# Patient Record
Sex: Female | Born: 1965 | Race: White | Hispanic: No | Marital: Single | Smoking: Never smoker
Health system: Southern US, Community
[De-identification: ages and names within clinical notes are randomized; demographics above are authoritative.]

## PROBLEM LIST (undated history)

## (undated) DIAGNOSIS — F32A Depression, unspecified: Secondary | ICD-10-CM

## (undated) DIAGNOSIS — J309 Allergic rhinitis, unspecified: Secondary | ICD-10-CM

## (undated) DIAGNOSIS — M21611 Bunion of right foot: Secondary | ICD-10-CM

## (undated) DIAGNOSIS — K219 Gastro-esophageal reflux disease without esophagitis: Secondary | ICD-10-CM

## (undated) DIAGNOSIS — F329 Major depressive disorder, single episode, unspecified: Secondary | ICD-10-CM

## (undated) DIAGNOSIS — Z973 Presence of spectacles and contact lenses: Secondary | ICD-10-CM

## (undated) DIAGNOSIS — F319 Bipolar disorder, unspecified: Secondary | ICD-10-CM

## (undated) DIAGNOSIS — M1990 Unspecified osteoarthritis, unspecified site: Secondary | ICD-10-CM

## (undated) DIAGNOSIS — F419 Anxiety disorder, unspecified: Secondary | ICD-10-CM

## (undated) HISTORY — PX: BUNIONECTOMY: SHX129

## (undated) HISTORY — PX: FOOT SURGERY: SHX648

---

## 2011-08-21 ENCOUNTER — Ambulatory Visit: Payer: Self-pay

## 2013-04-21 ENCOUNTER — Ambulatory Visit: Payer: Self-pay | Admitting: Obstetrics and Gynecology

## 2013-04-24 ENCOUNTER — Ambulatory Visit: Payer: Self-pay | Admitting: Obstetrics and Gynecology

## 2014-08-16 ENCOUNTER — Ambulatory Visit: Payer: Self-pay

## 2014-08-20 ENCOUNTER — Ambulatory Visit: Payer: Self-pay | Admitting: Family Medicine

## 2015-02-03 ENCOUNTER — Encounter: Payer: Self-pay | Admitting: *Deleted

## 2015-02-09 ENCOUNTER — Ambulatory Visit: Payer: BLUE CROSS/BLUE SHIELD | Admitting: Anesthesiology

## 2015-02-09 ENCOUNTER — Encounter
Admission: RE | Disposition: A | Discharge status: Home / Self Care | Payer: Self-pay | Source: Ambulatory Visit | Attending: Podiatry

## 2015-02-09 ENCOUNTER — Ambulatory Visit
Admission: RE | Admit: 2015-02-09 | Discharge: 2015-02-09 | Disposition: A | Discharge status: Home / Self Care | Payer: BLUE CROSS/BLUE SHIELD | Source: Ambulatory Visit | Attending: Podiatry | Admitting: Podiatry

## 2015-02-09 DIAGNOSIS — M2011 Hallux valgus (acquired), right foot: Secondary | ICD-10-CM | POA: Insufficient documentation

## 2015-02-09 HISTORY — DX: Unspecified osteoarthritis, unspecified site: M19.90

## 2015-02-09 HISTORY — PX: HALLUX VALGUS AUSTIN: SHX6623

## 2015-02-09 HISTORY — DX: Gastro-esophageal reflux disease without esophagitis: K21.9

## 2015-02-09 HISTORY — DX: Depression, unspecified: F32.A

## 2015-02-09 HISTORY — DX: Bipolar disorder, unspecified: F31.9

## 2015-02-09 HISTORY — DX: Presence of spectacles and contact lenses: Z97.3

## 2015-02-09 HISTORY — DX: Anxiety disorder, unspecified: F41.9

## 2015-02-09 HISTORY — DX: Major depressive disorder, single episode, unspecified: F32.9

## 2015-02-09 HISTORY — DX: Allergic rhinitis, unspecified: J30.9

## 2015-02-09 HISTORY — DX: Bunion of right foot: M21.611

## 2015-02-09 SURGERY — CORRECTION, HALLUX VALGUS
Anesthesia: Regional | Laterality: Right | Wound class: Clean

## 2015-02-09 MED ORDER — CEFAZOLIN SODIUM-DEXTROSE 2-3 GM-% IV SOLR
2.0000 g | Freq: Once | INTRAVENOUS | Status: AC
  Administered 2015-02-09: 2 g via INTRAVENOUS

## 2015-02-09 MED ORDER — OXYCODONE HCL 5 MG/5ML PO SOLN: 5.0000 mg | Freq: Once | ORAL | Status: DC | PRN

## 2015-02-09 MED ORDER — KETOROLAC TROMETHAMINE 30 MG/ML IJ SOLN
30.0000 mg | Freq: Once | INTRAMUSCULAR | Status: AC | PRN
  Administered 2015-02-09: 30 mg via INTRAVENOUS

## 2015-02-09 MED ORDER — DEXAMETHASONE SODIUM PHOSPHATE 4 MG/ML IJ SOLN
INTRAMUSCULAR | Status: DC | PRN
  Administered 2015-02-09: 8 mg via INTRAVENOUS

## 2015-02-09 MED ORDER — OXYCODONE-ACETAMINOPHEN 5-325 MG PO TABS: 1.0000 | ORAL_TABLET | ORAL | Status: DC | PRN

## 2015-02-09 MED ORDER — ONDANSETRON HCL 4 MG/2ML IJ SOLN: 4.0000 mg | Freq: Four times a day (QID) | INTRAMUSCULAR | Status: DC | PRN

## 2015-02-09 MED ORDER — OXYCODONE HCL 5 MG PO TABS: 5.0000 mg | ORAL_TABLET | Freq: Once | ORAL | Status: DC | PRN

## 2015-02-09 MED ORDER — LACTATED RINGERS IV SOLN
INTRAVENOUS | Status: DC
  Administered 2015-02-09: 07:00:00 via INTRAVENOUS

## 2015-02-09 MED ORDER — ROPIVACAINE HCL 5 MG/ML IJ SOLN
INTRAMUSCULAR | Status: DC | PRN
  Administered 2015-02-09: 200 mg via EPIDURAL

## 2015-02-09 MED ORDER — PROPOFOL 10 MG/ML IV BOLUS
INTRAVENOUS | Status: DC | PRN
  Administered 2015-02-09: 150 mg via INTRAVENOUS

## 2015-02-09 MED ORDER — FENTANYL CITRATE (PF) 100 MCG/2ML IJ SOLN
INTRAMUSCULAR | Status: DC | PRN
  Administered 2015-02-09: 100 ug via INTRAVENOUS

## 2015-02-09 MED ORDER — LIDOCAINE HCL (CARDIAC) 20 MG/ML IV SOLN
INTRAVENOUS | Status: DC | PRN
  Administered 2015-02-09: 40 mg via INTRATRACHEAL

## 2015-02-09 MED ORDER — ONDANSETRON HCL 4 MG/2ML IJ SOLN
INTRAMUSCULAR | Status: DC | PRN
  Administered 2015-02-09: 4 mg via INTRAVENOUS

## 2015-02-09 MED ORDER — BUPIVACAINE HCL (PF) 0.25 % IJ SOLN
INTRAMUSCULAR | Status: DC | PRN
  Administered 2015-02-09: 10 mL

## 2015-02-09 MED ORDER — MIDAZOLAM HCL 5 MG/5ML IJ SOLN
INTRAMUSCULAR | Status: DC | PRN
  Administered 2015-02-09: 2 mg via INTRAVENOUS

## 2015-02-09 MED ORDER — HYDROMORPHONE HCL 1 MG/ML IJ SOLN: 0.2500 mg | INTRAMUSCULAR | Status: DC | PRN

## 2015-02-09 MED ORDER — ONDANSETRON HCL 4 MG PO TABS: 4.0000 mg | ORAL_TABLET | Freq: Four times a day (QID) | ORAL | Status: DC | PRN

## 2015-02-09 SURGICAL SUPPLY — 50 items
BENZOIN TINCTURE PRP APPL 2/3 (GAUZE/BANDAGES/DRESSINGS) ×2 IMPLANT
BLADE MED AGGRESSIVE (BLADE) ×2 IMPLANT
BLADE OSC/SAGITTAL 5.5X25 (BLADE) IMPLANT
BLADE OSC/SAGITTAL MD 5.5X18 (BLADE) IMPLANT
BLADE OSC/SAGITTAL MD 9X18.5 (BLADE) IMPLANT
BLADE SURG 15 STRL LF DISP TIS (BLADE) IMPLANT
BLADE SURG 15 STRL SS (BLADE)
BNDG ESMARK 4X12 TAN STRL LF (GAUZE/BANDAGES/DRESSINGS) ×2 IMPLANT
BNDG GAUZE 4.5X4.1 6PLY STRL (MISCELLANEOUS) ×2 IMPLANT
BNDG STRETCH 4X75 STRL LF (GAUZE/BANDAGES/DRESSINGS) ×2 IMPLANT
CANISTER SUCT 1200ML W/VALVE (MISCELLANEOUS) ×2 IMPLANT
COVER PIN YLW 0.028-062 (MISCELLANEOUS) IMPLANT
CUFF TOURNIQUET DUAL PORT 18X3 (MISCELLANEOUS) ×2 IMPLANT
DRAPE FLUOR MINI C-ARM 54X84 (DRAPES) ×2 IMPLANT
DURAPREP 26ML APPLICATOR (WOUND CARE) ×2 IMPLANT
GAUZE PETRO XEROFOAM 1X8 (MISCELLANEOUS) ×2 IMPLANT
GAUZE PETRO XEROFOAM 5X9 (MISCELLANEOUS) IMPLANT
GAUZE SPONGE 4X4 12PLY STRL (GAUZE/BANDAGES/DRESSINGS) ×2 IMPLANT
GLOVE BIO SURGEON STRL SZ7.5 (GLOVE) ×2 IMPLANT
GLOVE INDICATOR 8.0 STRL GRN (GLOVE) ×2 IMPLANT
GOWN STRL REUS W/ TWL LRG LVL3 (GOWN DISPOSABLE) ×2 IMPLANT
GOWN STRL REUS W/TWL LRG LVL3 (GOWN DISPOSABLE) ×2
K-WIRE DBL END TROCAR 6X.045 (WIRE) ×2
K-WIRE DBL END TROCAR 6X.062 (WIRE) ×2
KWIRE DBL END TROCAR 6X.045 (WIRE) ×1 IMPLANT
KWIRE DBL END TROCAR 6X.062 (WIRE) ×1 IMPLANT
NS IRRIG 500ML POUR BTL (IV SOLUTION) ×2 IMPLANT
PACK EXTREMITY ARMC (MISCELLANEOUS) ×2 IMPLANT
PAD GROUND ADULT SPLIT (MISCELLANEOUS) ×2 IMPLANT
RASP SM TEAR CROSS CUT (RASP) ×2 IMPLANT
SPLINT CAST 1 STEP 4X30 (MISCELLANEOUS) IMPLANT
STOCKINETTE STRL 6IN 960660 (GAUZE/BANDAGES/DRESSINGS) ×2 IMPLANT
STRIP CLOSURE SKIN 1/4X4 (GAUZE/BANDAGES/DRESSINGS) ×2 IMPLANT
SUT ETHILON 4-0 (SUTURE)
SUT ETHILON 4-0 FS2 18XMFL BLK (SUTURE)
SUT ETHILON 5-0 FS-2 18 BLK (SUTURE) IMPLANT
SUT MNCRL 4-0 (SUTURE)
SUT MNCRL 4-0 27XMFL (SUTURE)
SUT MNCRL 5-0+ PC-1 (SUTURE) ×1 IMPLANT
SUT MONOCRYL 5-0 (SUTURE) ×1
SUT VIC AB 0 CT1 36 (SUTURE) IMPLANT
SUT VIC AB 2-0 SH 27 (SUTURE)
SUT VIC AB 2-0 SH 27XBRD (SUTURE) IMPLANT
SUT VIC AB 3-0 SH 27 (SUTURE) ×1
SUT VIC AB 3-0 SH 27X BRD (SUTURE) ×1 IMPLANT
SUT VIC AB 4-0 FS2 27 (SUTURE) ×2 IMPLANT
SUT VICRYL AB 3-0 FS1 BRD 27IN (SUTURE) IMPLANT
SUTURE ETHLN 4-0 FS2 18XMF BLK (SUTURE) IMPLANT
SUTURE MNCRL 4-0 27XMF (SUTURE) IMPLANT
k wire (Wire) ×2 IMPLANT

## 2015-02-09 NOTE — Anesthesia Procedure Notes (Addendum)
Procedure Name: LMA Insertion Date/Time: 02/09/2015 7:44 AM Performed by: Andee Poles Pre-anesthesia Checklist: Patient identified, Emergency Drugs available, Suction available, Timeout performed and Patient being monitored Patient Re-evaluated:Patient Re-evaluated prior to inductionOxygen Delivery Method: Circle system utilized Preoxygenation: Pre-oxygenation with 100% oxygen Intubation Type: IV induction LMA: LMA inserted LMA Size: 4.0 Number of attempts: 1 Placement Confirmation: positive ETCO2 and breath sounds checked- equal and bilateral Tube secured with: Tape   Anesthesia Regional Block:  Popliteal block  Pre-Anesthetic Checklist: ,, timeout performed, Correct Patient, Correct Site, Correct Laterality, Correct Procedure, Correct Position, site marked, Risks and benefits discussed,  Surgical consent,  Pre-op evaluation,  At surgeon's request and post-op pain management  Laterality: Right  Prep: chloraprep       Needles:  Injection technique: Single-shot  Needle Type: Echogenic Needle     Needle Length: 9cm 9 cm Needle Gauge: 21 and 21 G    Additional Needles:  Procedures: ultrasound guided (picture in chart) Popliteal block Narrative:  Injection made incrementally with aspirations every 5 mL.  Performed by: Personally  Anesthesiologist: Durene Fruits  Additional Notes: Functioning IV was confirmed and monitors applied. Ultrasound guidance: relevant anatomy identified, needle position confirmed, local anesthetic spread visualized around nerve(s)., vascular puncture avoided.  Image printed for medical record.  Negative aspiration and no paresthesias; incremental administration of local anesthetic. The patient tolerated the procedure well. Vitals signes recorded in RN notes.

## 2015-02-09 NOTE — Anesthesia Postprocedure Evaluation (Signed)
  Anesthesia Post-op Note  Patient: Rebecca Pena  Procedure(s) Performed: Procedure(s) with comments: HALLUX VALGUS [place] (Right) - POPLITEAL  Anesthesia type:Regional, General  Patient location: PACU  Post pain: Pain level controlled  Post assessment: Post-op Vital signs reviewed, Patient's Cardiovascular Status Stable, Respiratory Function Stable, Patent Airway and No signs of Nausea or vomiting  Post vital signs: Reviewed and stable  Last Vitals:  Filed Vitals:   02/09/15 0915  BP: 118/79  Pulse: 70  Temp:   Resp: 10    Level of consciousness: awake, alert  and patient cooperative  Complications: No apparent anesthesia complications

## 2015-02-09 NOTE — Transfer of Care (Signed)
Immediate Anesthesia Transfer of Care Note  Patient: Rebecca Pena  Procedure(s) Performed: Procedure(s) with comments: HALLUX VALGUS [place]IN (Right) - POPLITEAL  Patient Location: PACU  Anesthesia Type: Regional, General  Level of Consciousness: awake, alert  and patient cooperative  Airway and Oxygen Therapy: Patient Spontanous Breathing and Patient connected to supplemental oxygen  Post-op Assessment: Post-op Vital signs reviewed, Patient's Cardiovascular Status Stable, Respiratory Function Stable, Patent Airway and No signs of Nausea or vomiting  Post-op Vital Signs: Reviewed and stable  Complications: No apparent anesthesia complications

## 2015-02-09 NOTE — H&P (Signed)
HISTORY AND PHYSICAL INTERVAL NOTE:  02/09/2015  7:23 AM  Rebecca Pena  has presented today for surgery, with the diagnosis of M20.11 HALLUX VALGUS.  The various methods of treatment have been discussed with the patient.  No guarantees were given.  After consideration of risks, benefits and other options for treatment, the patient has consented to surgery.  I have reviewed the patients' chart and labs.    Patient Vitals for the past 24 hrs:  BP Temp Pulse Resp SpO2 Height Weight  02/09/15 0659 118/78 mmHg 98.1 F (36.7 C) 68 16 100 %  (1.676 m) 67.132 kg (148 lb)    A history and physical examination was performed in my office.  The patient was reexamined.  There have been no changes to this history and physical examination.  Rebecca Pena A

## 2015-02-09 NOTE — Anesthesia Preprocedure Evaluation (Signed)
Anesthesia Evaluation  Patient identified by MRN, date of birth, ID band Patient awake    Reviewed: Allergy & Precautions, NPO status , Patient's Chart, lab work & pertinent test results  Airway Mallampati: II  TM Distance: >3 FB Neck ROM: Full    Dental   Pulmonary    Pulmonary exam normal       Cardiovascular Normal cardiovascular exam    Neuro/Psych PSYCHIATRIC DISORDERS Anxiety Depression Bipolar Disorder    GI/Hepatic GERD-  ,  Endo/Other    Renal/GU      Musculoskeletal   Abdominal   Peds  Hematology   Anesthesia Other Findings   Reproductive/Obstetrics                             Anesthesia Physical Anesthesia Plan  ASA: II  Anesthesia Plan: Regional and General   Post-op Pain Management: MAC Combined w/ Regional for Post-op pain   Induction: Intravenous  Airway Management Planned: LMA  Additional Equipment:   Intra-op Plan:   Post-operative Plan:   Informed Consent: I have reviewed the patients History and Physical, chart, labs and discussed the procedure including the risks, benefits and alternatives for the proposed anesthesia with the patient or authorized representative who has indicated his/her understanding and acceptance.     Plan Discussed with: CRNA  Anesthesia Plan Comments:         Anesthesia Quick Evaluation

## 2015-02-09 NOTE — Discharge Instructions (Signed)
[facility] °[facility] ° °POST OPERATIVE INSTRUCTIONS FOR DR. TROXLER AND DR. FOWLER °[facility] ° ° °1. Take your medication as prescribed.  Pain medication should be taken only as needed. ° °2. Keep the dressing clean, dry and intact. ° °3. Keep your foot elevated above the heart level for the first 48 hours. ° °4. Walking to the bathroom and brief periods of walking are acceptable, unless we have instructed you to be non-weight bearing. ° °5. Always wear your post-op shoe when walking.  Always use your crutches if you are to be non-weight bearing. ° °6. Do not take a shower. Baths are permissible as long as the foot is kept out of the water.  ° °7. Every hour you are awake:  °- Bend your knee 15 times. °- Flex foot 15 times °- Massage calf 15 times ° °8. Call [facility] ([phone]) if any of the following problems occur: °- You develop a temperature or fever. °- The bandage becomes saturated with blood. °- Medication does not stop your pain. °- Injury of the foot occurs. °- Any symptoms of infection including redness, odor, or red streaks running from wound. °-  ° °General Anesthesia, Care After °Refer to this sheet in the next few weeks. These instructions provide you with information on caring for yourself after your procedure. Your health care provider may also give you more specific instructions. Your treatment has been planned according to current medical practices, but problems sometimes occur. Call your health care provider if you have any problems or questions after your procedure. °WHAT TO EXPECT AFTER THE PROCEDURE °After the procedure, it is typical to experience: °· Sleepiness. °· Nausea and vomiting. °HOME CARE INSTRUCTIONS °· For the first 24 hours after general anesthesia: °¨ Have a responsible person with you. °¨ Do not drive a car. If you are alone, do not take public transportation. °¨ Do not drink alcohol. °¨ Do not take medicine  that has not been prescribed by your health care provider. °¨ Do not sign important papers or make important decisions. °¨ You may resume a normal diet and activities as directed by your health care provider. °· Change bandages (dressings) as directed. °· If you have questions or problems that seem related to general anesthesia, call the hospital and ask for the anesthetist or anesthesiologist on call. °SEEK MEDICAL CARE IF: °· You have nausea and vomiting that continue the day after anesthesia. °· You develop a rash. °SEEK IMMEDIATE MEDICAL CARE IF:  °· You have difficulty breathing. °· You have chest pain. °· You have any allergic problems. °Document Released: 10/01/2000 Document Revised: 06/30/2013 Document Reviewed: 01/08/2013 °ExitCare® Patient Information ©2015 [employer]. This information is not intended to replace advice given to you by your health care provider. Make sure you discuss any questions you have with your health care provider. ° °

## 2015-02-09 NOTE — Op Note (Signed)
Operative note   Surgeon:Tamiya Colello [employer] forces logistics/support/administrative officer: None    Preop diagnosis: Hallux valgus right foot    Postop diagnosis: Same    Procedure: [place] hallux valgus correction right foot    EBL: Minimal    Anesthesia:regional and general    Hemostasis: Ankle tourniquet inflated to 250 mmHg for approximately 40 minutes    Specimen: None    Complications: None    Operative indications: 49 year old female with a complaint of a hallux valgus deformity on her right foot. She had undergone previous surgical intervention on her left foot. She presents today for surgery. The risks benefits alternatives and competitions associated were discussed with the patient in for an consent has been given    Procedure:  Patient was brought into the OR and placed on the operating table in thesupine position. After anesthesia was obtained theright lower extremity was prepped and draped in usual sterile fashion.  Attention was directed to the dorsomedial right first MTPJ where a linear incision was made. Sharp and blunt dissection was carried down to the capsule. Next a T capsulotomy was performed. The prominent dorsal medial eminence was transected with a power saw. A V osteotomy was then created from medial to lateral. This was translocated laterally. This was stabilized with a 0.062 K wire. The ensuing overhanging ledge was then transected with a power saw. All areas were smoothed with a power rasp. Good realignment was noted on fluoroscopy. Good range of motion was noted to the first MTPJ. At this time closure was then performed. Small capsulorrhaphy was performed medially. 3-0 Vicryl was used for the capsular closure. 4-0 Vicryl was used for the subcutaneous tissue. 5-0 Monocryl was used for the skin. 0.25% Marcaine was then placed around all areas. A well compressive bulky sterile dressing was then placed.    Patient tolerated the procedure and anesthesia well.  Was transported from the OR to the PACU  with all vital signs stable and vascular status intact. To be discharged per routine protocol.  Will follow up in approximately 1 week in the outpatient clinic.

## 2015-02-10 ENCOUNTER — Encounter: Payer: Self-pay | Admitting: Podiatry

## 2015-03-16 ENCOUNTER — Other Ambulatory Visit: Payer: Self-pay | Admitting: Obstetrics & Gynecology

## 2015-03-16 DIAGNOSIS — N63 Unspecified lump in unspecified breast: Secondary | ICD-10-CM

## 2015-03-28 ENCOUNTER — Other Ambulatory Visit: Payer: BLUE CROSS/BLUE SHIELD

## 2015-03-28 ENCOUNTER — Ambulatory Visit: Payer: BLUE CROSS/BLUE SHIELD

## 2015-05-12 ENCOUNTER — Other Ambulatory Visit: Payer: Self-pay | Admitting: Obstetrics & Gynecology

## 2015-05-12 ENCOUNTER — Ambulatory Visit
Admission: RE | Admit: 2015-05-12 | Discharge: 2015-05-12 | Disposition: A | Discharge status: Home / Self Care | Payer: BLUE CROSS/BLUE SHIELD | Source: Ambulatory Visit | Attending: Obstetrics & Gynecology | Admitting: Obstetrics & Gynecology

## 2015-05-12 DIAGNOSIS — N63 Unspecified lump in unspecified breast: Secondary | ICD-10-CM

## 2015-05-12 DIAGNOSIS — N6002 Solitary cyst of left breast: Secondary | ICD-10-CM | POA: Insufficient documentation

## 2016-01-31 ENCOUNTER — Other Ambulatory Visit: Payer: Self-pay | Admitting: Gastroenterology

## 2016-01-31 DIAGNOSIS — K219 Gastro-esophageal reflux disease without esophagitis: Secondary | ICD-10-CM

## 2016-01-31 DIAGNOSIS — R1013 Epigastric pain: Secondary | ICD-10-CM

## 2016-02-09 ENCOUNTER — Ambulatory Visit
Admission: RE | Admit: 2016-02-09 | Discharge: 2016-02-09 | Disposition: A | Discharge status: Home / Self Care | Payer: BLUE CROSS/BLUE SHIELD | Source: Ambulatory Visit | Attending: Gastroenterology | Admitting: Gastroenterology

## 2016-02-09 DIAGNOSIS — R1013 Epigastric pain: Secondary | ICD-10-CM | POA: Diagnosis present

## 2016-02-09 DIAGNOSIS — K219 Gastro-esophageal reflux disease without esophagitis: Secondary | ICD-10-CM | POA: Insufficient documentation

## 2016-02-28 ENCOUNTER — Other Ambulatory Visit: Payer: Self-pay | Admitting: Obstetrics & Gynecology

## 2016-02-28 DIAGNOSIS — Z1231 Encounter for screening mammogram for malignant neoplasm of breast: Secondary | ICD-10-CM

## 2016-02-29 ENCOUNTER — Ambulatory Visit
Admission: RE | Admit: 2016-02-29 | Discharge: 2016-02-29 | Disposition: A | Discharge status: Home / Self Care | Payer: BLUE CROSS/BLUE SHIELD | Source: Ambulatory Visit | Attending: Obstetrics & Gynecology | Admitting: Obstetrics & Gynecology

## 2016-02-29 ENCOUNTER — Other Ambulatory Visit: Payer: Self-pay | Admitting: Obstetrics & Gynecology

## 2016-02-29 DIAGNOSIS — Z1231 Encounter for screening mammogram for malignant neoplasm of breast: Secondary | ICD-10-CM | POA: Insufficient documentation

## 2016-05-03 ENCOUNTER — Encounter: Payer: Self-pay | Admitting: *Deleted

## 2016-05-04 ENCOUNTER — Ambulatory Visit: Payer: BLUE CROSS/BLUE SHIELD | Admitting: Certified Registered Nurse Anesthetist

## 2016-05-04 ENCOUNTER — Encounter
Admission: RE | Disposition: A | Discharge status: Home / Self Care | Payer: Self-pay | Source: Ambulatory Visit | Attending: Gastroenterology

## 2016-05-04 ENCOUNTER — Encounter: Payer: Self-pay | Admitting: Certified Registered Nurse Anesthetist

## 2016-05-04 ENCOUNTER — Ambulatory Visit
Admission: RE | Admit: 2016-05-04 | Discharge: 2016-05-04 | Disposition: A | Discharge status: Home / Self Care | Payer: BLUE CROSS/BLUE SHIELD | Source: Ambulatory Visit | Attending: Gastroenterology | Admitting: Gastroenterology

## 2016-05-04 DIAGNOSIS — M19071 Primary osteoarthritis, right ankle and foot: Secondary | ICD-10-CM | POA: Diagnosis not present

## 2016-05-04 DIAGNOSIS — F319 Bipolar disorder, unspecified: Secondary | ICD-10-CM | POA: Diagnosis not present

## 2016-05-04 DIAGNOSIS — R1013 Epigastric pain: Secondary | ICD-10-CM | POA: Diagnosis not present

## 2016-05-04 DIAGNOSIS — K5730 Diverticulosis of large intestine without perforation or abscess without bleeding: Secondary | ICD-10-CM | POA: Diagnosis not present

## 2016-05-04 DIAGNOSIS — M170 Bilateral primary osteoarthritis of knee: Secondary | ICD-10-CM | POA: Diagnosis not present

## 2016-05-04 DIAGNOSIS — K3189 Other diseases of stomach and duodenum: Secondary | ICD-10-CM | POA: Insufficient documentation

## 2016-05-04 DIAGNOSIS — Z1211 Encounter for screening for malignant neoplasm of colon: Secondary | ICD-10-CM | POA: Insufficient documentation

## 2016-05-04 DIAGNOSIS — K219 Gastro-esophageal reflux disease without esophagitis: Secondary | ICD-10-CM | POA: Diagnosis not present

## 2016-05-04 DIAGNOSIS — M19072 Primary osteoarthritis, left ankle and foot: Secondary | ICD-10-CM | POA: Insufficient documentation

## 2016-05-04 HISTORY — PX: COLONOSCOPY: SHX5424

## 2016-05-04 HISTORY — PX: ESOPHAGOGASTRODUODENOSCOPY (EGD) WITH PROPOFOL: SHX5813

## 2016-05-04 LAB — POCT PREGNANCY, URINE: Preg Test, Ur: NEGATIVE

## 2016-05-04 SURGERY — COLONOSCOPY
Anesthesia: General

## 2016-05-04 MED ORDER — MIDAZOLAM HCL 2 MG/2ML IJ SOLN
INTRAMUSCULAR | Status: DC | PRN
  Administered 2016-05-04: 2 mg via INTRAVENOUS

## 2016-05-04 MED ORDER — PROPOFOL 500 MG/50ML IV EMUL
INTRAVENOUS | Status: DC | PRN
  Administered 2016-05-04: 120 ug/kg/min via INTRAVENOUS

## 2016-05-04 MED ORDER — LIDOCAINE HCL (CARDIAC) 20 MG/ML IV SOLN
INTRAVENOUS | Status: DC | PRN
  Administered 2016-05-04: 30 mg via INTRAVENOUS

## 2016-05-04 MED ORDER — ONDANSETRON HCL 4 MG/2ML IJ SOLN
INTRAMUSCULAR | Status: DC | PRN
  Administered 2016-05-04: 4 mg via INTRAVENOUS

## 2016-05-04 MED ORDER — SODIUM CHLORIDE 0.9 % IV SOLN: INTRAVENOUS | Status: DC

## 2016-05-04 MED ORDER — PROPOFOL 10 MG/ML IV BOLUS
INTRAVENOUS | Status: DC | PRN
  Administered 2016-05-04: 10 mg via INTRAVENOUS
  Administered 2016-05-04: 20 mg via INTRAVENOUS
  Administered 2016-05-04 (×2): 30 mg via INTRAVENOUS

## 2016-05-04 MED ORDER — SODIUM CHLORIDE 0.9 % IV SOLN
INTRAVENOUS | Status: DC
  Administered 2016-05-04: 1000 mL via INTRAVENOUS

## 2016-05-04 NOTE — Anesthesia Postprocedure Evaluation (Signed)
Anesthesia Post Note  Patient: Rebecca Pena  Procedure(s) Performed: Procedure(s) (LRB): COLONOSCOPY (N/A) ESOPHAGOGASTRODUODENOSCOPY (EGD) WITH PROPOFOL  Patient location during evaluation: PACU Anesthesia Type: General Level of consciousness: awake Pain management: pain level controlled Vital Signs Assessment: post-procedure vital signs reviewed and stable Respiratory status: nonlabored ventilation Cardiovascular status: stable Anesthetic complications: no    Last Vitals:  Vitals:   05/04/16 0903 05/04/16 0913  BP:  106/75  Pulse: 62 61  Resp: (!) 22 14  Temp:      Last Pain:  Vitals:   05/04/16 0853  TempSrc: Tympanic                 VAN STAVEREN,Kadir Azucena

## 2016-05-04 NOTE — Anesthesia Preprocedure Evaluation (Addendum)
Anesthesia Evaluation  Patient identified by MRN, date of birth, ID band Patient awake    Reviewed: Allergy & Precautions, NPO status , Patient's Chart, lab work & pertinent test results  Airway Mallampati: II       Dental  (+) Teeth Intact   Pulmonary neg pulmonary ROS,    breath sounds clear to auscultation       Cardiovascular Exercise Tolerance: Good  Rhythm:Regular Rate:Normal     Neuro/Psych Depression Bipolar Disorder    GI/Hepatic Neg liver ROS, GERD  Medicated,  Endo/Other  negative endocrine ROS  Renal/GU      Musculoskeletal   Abdominal Normal abdominal exam  (+)   Peds negative pediatric ROS (+)  Hematology negative hematology ROS (+)   Anesthesia Other Findings   Reproductive/Obstetrics                             Anesthesia Physical Anesthesia Plan  ASA: II  Anesthesia Plan: General   Post-op Pain Management:    Induction: Intravenous  Airway Management Planned: Natural Airway and Nasal Cannula  Additional Equipment:   Intra-op Plan:   Post-operative Plan:   Informed Consent: I have reviewed the patients History and Physical, chart, labs and discussed the procedure including the risks, benefits and alternatives for the proposed anesthesia with the patient or authorized representative who has indicated his/her understanding and acceptance.     Plan Discussed with: CRNA  Anesthesia Plan Comments:         Anesthesia Quick Evaluation

## 2016-05-04 NOTE — H&P (Signed)
Outpatient short stay form Pre-procedure 05/04/2016 7:44 AM Rebecca Pena Stephane Junkins MD  Primary Physician: Dr Liam GrahamArrita Pena  Reason for visit:  EGD and colonoscopy  History of present illness:  Patient is a 50 year old female presenting today as above. She has personal history of some reflux issues with excessive eructation that has been more so noted since this year began. She does not have any dysphagia. He did have a barium swallow with normal tablet movement and small amount reflux. Further she is presenting for a screening colonoscopy. This is this is her first colonoscopy. She tolerated her prep well. She takes no aspirin or blood thinning agents.    Current Facility-Administered Medications:  .  0.9 %  sodium chloride infusion, , Intravenous, Continuous, Rebecca Pena Gelila Well, MD, Last Rate: 20 mL/hr at 05/04/16 0723, 1,000 mL at 05/04/16 0723 .  0.9 %  sodium chloride infusion, , Intravenous, Continuous, Rebecca Pena Thermon Zulauf, MD  Prescriptions Prior to Admission  Medication Sig Dispense Refill Last Dose  . divalproex (DEPAKOTE ER) 500 MG 24 hr tablet Take 1,000 mg by mouth daily. NIGHTLY   02/08/2015 at Unknown time  . fexofenadine (ALLEGRA) 30 MG tablet Take 30 mg by mouth daily.   02/08/2015 at Unknown time  . fluticasone (FLONASE) 50 MCG/ACT nasal spray Place into both nostrils daily.   Past Week at Unknown time  . ipratropium (ATROVENT) 0.03 % nasal spray Place 2 sprays into both nostrils 2 (two) times daily.   Not Taking at Unknown time  . lithium carbonate (LITHOBID) 300 MG CR tablet Take 600 mg by mouth at bedtime.   02/08/2015 at Unknown time  . montelukast (SINGULAIR) 10 MG tablet Take 10 mg by mouth at bedtime.   Past Week at Unknown time  . oxyCODONE-acetaminophen (ROXICET) 5-325 MG per tablet Take 1-2 tablets by mouth every 4 (four) hours as needed for severe pain. 30 tablet 0      No Known Allergies   Past Medical History:  Diagnosis Date  . Allergic rhinitis    SINUSITIS  .  Anxiety   . Arthritis    feet, knees  . Bipolar affective disorder (HCC)    [facility]  . Bunion of right foot   . Depression   . GERD (gastroesophageal reflux disease)   . Wears contact lenses    one eye only    Review of systems:      Physical Exam    Heart and lungs: Regular rate and rhythm without rub or gallop, lungs are bilaterally clear.    HEENT: Normocephalic atraumatic eyes are anicteric    Other:     Pertinant exam for procedure: Soft nontender nondistended bowel sounds positive normoactive.    Planned proceedures: EGD, colonoscopy and indicated procedures. I have discussed the risks benefits and complications of procedures to include not limited to bleeding, infection, perforation and the risk of sedation and the patient wishes to proceed.    Rebecca Pena Rebecca Mcglown, MD Gastroenterology 05/04/2016  7:44 AM

## 2016-05-04 NOTE — Anesthesia Procedure Notes (Signed)
Date/Time: 05/04/2016 7:45 AM Performed by: Ginger CarneMICHELET, Jeffree Cazeau Pre-anesthesia Checklist: Patient identified, Emergency Drugs available, Suction available, Patient being monitored and Timeout performed Patient Re-evaluated:Patient Re-evaluated prior to inductionOxygen Delivery Method: Nasal cannula

## 2016-05-04 NOTE — Op Note (Signed)
[facility] Gastroenterology Patient Name: Rebecca Pena Procedure Date: 05/04/2016 7:42 AM MRN: [id number] Account #: [id number] Date of Birth: 04-Jun-1966 Admit Type: Outpatient Age: 50 Room: [facility] ENDO ROOM 1 Gender: Female Note Status: Finalized Procedure:            Colonoscopy Indications:          Screening for colorectal malignant neoplasm Providers:            Christena Deem, MD Medicines:            Monitored Anesthesia Care Complications:        No immediate complications. Procedure:            Pre-Anesthesia Assessment:                       - ASA Grade Assessment: II - A patient with mild                        systemic disease.                       After obtaining informed consent, the colonoscope was                        passed under direct vision. Throughout the procedure,                        the patient's blood pressure, pulse, and oxygen                        saturations were monitored continuously. The                        Colonoscope was introduced through the anus and                        advanced to the the cecum, identified by appendiceal                        orifice and ileocecal valve. The colonoscopy was                        unusually difficult due to significant looping and a                        tortuous colon. Successful completion of the procedure                        was aided by changing the patient to a supine position,                        changing the patient to a prone position, using manual                        pressure and changing endoscopes. The patient tolerated                        the procedure well. The quality of the bowel                        preparation was good  except the sigmoid colon was fair. Findings:      A 3 mm polyp was found in the rectum. The polyp was sessile. The polyp       was removed with a cold biopsy forceps. Resection and retrieval were       complete.      Multiple  small and large-mouthed diverticula were found in the sigmoid       colon and distal descending colon.      The sigmoid colon, descending colon and splenic flexure were moderately       tortuous.      The retroflexed view of the distal rectum and anal verge was normal and       showed no anal or rectal abnormalities.      The digital rectal exam was normal.      The terminal ileum appeared normal. Impression:           - One 3 mm polyp in the rectum, removed with a cold                        biopsy forceps. Resected and retrieved.                       - Diverticulosis in the sigmoid colon and in the distal                        descending colon.                       - Tortuous colon.                       - The distal rectum and anal verge are normal on                        retroflexion view. Recommendation:       - Discharge patient to home. Procedure Code(s):    --- Professional ---                       [phone], Colonoscopy, flexible; with biopsy, single or                        multiple Diagnosis Code(s):    --- Professional ---                       Z12.11, Encounter for screening for malignant neoplasm                        of colon                       K62.1, Rectal polyp                       K57.30, Diverticulosis of large intestine without                        perforation or abscess without bleeding                       Q43.8, Other specified congenital malformations of  intestine CPT copyright 2016 American Medical Association. All rights reserved. The codes documented in this report are preliminary and upon coder review may  be revised to meet current compliance requirements. Christena DeemMartin U Ariaunna Longsworth, MD 05/04/2016 8:52:42 AM This report has been signed electronically. Number of Addenda: 0 Note Initiated On: 05/04/2016 7:42 AM Scope Withdrawal Time: 0 hours 8 minutes 41 seconds  Total Procedure Duration: 0 hours 36 minutes 4 seconds        [facility]

## 2016-05-04 NOTE — Op Note (Signed)
[facility] Gastroenterology Patient Name: Rebecca Pena Procedure Date: 05/04/2016 7:44 AM MRN: [id number] Account #: [id number] Date of Birth: 04/24/1966 Admit Type: Outpatient Age: 50 Room: [facility] ENDO ROOM 1 Gender: Female Note Status: Finalized Procedure:            Upper GI endoscopy Indications:          Epigastric abdominal pain Providers:            Christena Deem, MD Referring MD:         Lissa Morales, MD Medicines:            Monitored Anesthesia Care Complications:        No immediate complications. Procedure:            Pre-Anesthesia Assessment:                       - ASA Grade Assessment: II - A patient with mild                        systemic disease.                       After obtaining informed consent, the endoscope was                        passed under direct vision. Throughout the procedure,                        the patient's blood pressure, pulse, and oxygen                        saturations were monitored continuously. The Endoscope                        was introduced through the mouth, and advanced to the                        third part of duodenum. The upper GI endoscopy was                        accomplished without difficulty. The patient tolerated                        the procedure well. Findings:      The examined esophagus was normal. Biopsies were taken with a cold       forceps for histology. Two biopsies were obtained at the       gastroesophageal junction with cold forceps for histology.      Patchy minimal erythematous mucosa without bleeding was found in the       gastric body. Biopsies were taken with a cold forceps for histology.      The cardia and gastric fundus were normal on retroflexion.      Diffuse mild mucosal variance characterized by smoothness and altered       texture was found in the entire duodenum. Biopsies were taken with a       cold forceps for histology. Impression:           - Normal  esophagus. Biopsied.                       -  Erythematous mucosa in the gastric body. Biopsied.                       - Mucosal variant in the duodenum. Biopsied.                       - Two biopsies were obtained at the gastroesophageal                        junction. Recommendation:       - Continue present medications.                       - Perform a colonoscopy today. Procedure Code(s):    --- Professional ---                       [id number], Esophagogastroduodenoscopy, flexible, transoral;                        with biopsy, single or multiple Diagnosis Code(s):    --- Professional ---                       K31.89, Other diseases of stomach and duodenum                       R10.13, Epigastric pain CPT copyright 2016 American Medical Association. All rights reserved. The codes documented in this report are preliminary and upon coder review may  be revised to meet current compliance requirements. Christena DeemMartin U Skulskie, MD 05/04/2016 8:09:12 AM This report has been signed electronically. Number of Addenda: 0 Note Initiated On: 05/04/2016 7:44 AM      [facility]

## 2016-05-04 NOTE — Transfer of Care (Signed)
Immediate Anesthesia Transfer of Care Note  Patient: Rebecca HooverKimberly Rueger  Procedure(s) Performed: Procedure(s): COLONOSCOPY (N/A) ESOPHAGOGASTRODUODENOSCOPY (EGD) WITH PROPOFOL  Patient Location: PACU  Anesthesia Type:General  Level of Consciousness: sedated  Airway & Oxygen Therapy: Patient Spontanous Breathing and Patient connected to nasal cannula oxygen  Post-op Assessment: Report given to RN and Post -op Vital signs reviewed and stable  Post vital signs: Reviewed and stable  Last Vitals:  Vitals:   05/04/16 0703 05/04/16 0853  BP: 132/82 98/66  Pulse: 74 63  Resp: 16 14  Temp: 36.2 C 36.6 C    Last Pain:  Vitals:   05/04/16 0853  TempSrc: Tympanic         Complications: No apparent anesthesia complications

## 2016-05-07 ENCOUNTER — Encounter: Payer: Self-pay | Admitting: Gastroenterology

## 2016-05-07 LAB — SURGICAL PATHOLOGY

## 2016-10-24 ENCOUNTER — Ambulatory Visit (INDEPENDENT_AMBULATORY_CARE_PROVIDER_SITE_OTHER): Payer: BLUE CROSS/BLUE SHIELD | Admitting: Obstetrics & Gynecology

## 2016-10-24 ENCOUNTER — Encounter: Payer: Self-pay | Admitting: Obstetrics & Gynecology

## 2016-10-24 VITALS — BP 110/70 | HR 63 | Ht 66.0 in | Wt 149.0 lb

## 2016-10-24 DIAGNOSIS — A630 Anogenital (venereal) warts: Secondary | ICD-10-CM | POA: Diagnosis not present

## 2016-10-24 MED ORDER — IMIQUIMOD 5 % EX CREA: TOPICAL_CREAM | CUTANEOUS | 3 refills | Status: DC

## 2016-10-24 NOTE — Patient Instructions (Signed)
Imiquimod skin cream  What is this medicine?  IMIQUIMOD (i mi KWI mod) cream is used to treat external genital or anal warts. It is also used to treat other skin conditions such as actinic keratosis and certain types of skin cancer.  This medicine may be used for other purposes; ask your health care provider or pharmacist if you have questions.  COMMON BRAND NAME(S): Aldara, Zyclara  What should I tell my health care provider before I take this medicine?  They need to know if you have any of these conditions:  -decreased immune function  -an unusual or allergic reaction to imiquimod, other medicines, foods, dyes, or preservatives  -pregnant or trying to get pregnant  -breast-feeding  How should I use this medicine?  This medicine is for external use only. Do not take by mouth. Follow the directions on the prescription label. Apply just before bedtime. Wash your hands before and after use. Apply a thin layer of cream and massage gently into the affected areas until no longer visible. Do not use in the mouth, eyes or the vagina. Use this medicine only on the affected area as directed by your health care provider. Do not use for longer than prescribed. It is important not to use more medicine than prescribed. To do so may increase the chance of side effects.  Talk to your pediatrician regarding the use of this medicine in children. While this drug may be prescribed for children as young as 12 years of age for selected conditions, precautions do apply.  Overdosage: If you think you have taken too much of this medicine contact a poison control center or emergency room at once.  NOTE: This medicine is only for you. Do not share this medicine with others.  What if I miss a dose?  If you miss a dose, use it as soon as you can. If it is almost time for your next dose, use only that dose. Do not use double or extra doses.  What may interact with this medicine?  Interactions are not expected. Do not use any other medicines on  the treated area without asking your doctor or health care professional.  This list may not describe all possible interactions. Give your health care provider a list of all the medicines, herbs, non-prescription drugs, or dietary supplements you use. Also tell them if you smoke, drink alcohol, or use illegal drugs. Some items may interact with your medicine.  What should I watch for while using this medicine?  Visit your health care professional for regular checks on your progress.  Do not use this medicine until the skin has healed from any other drug (example: podofilox or podophyllin resin) or surgical skin treatment.  Females should receive regular pelvic exams while being treated for genital warts. Most patients see improvement within 4 weeks. It may take up to 16 weeks to see a full clearing of the warts. This medicine is not a cure. New warts may develop during or after treatment. Avoid sexual (genital, anal, oral) contact while the cream is on the skin. If warts are visible in the genital area, sexual contact should be avoided until the warts are treated. The use of latex condoms during sexual contact may reduce, but not entirely prevent, infecting others. This medicine may weaken condoms, diaphragms, cervical caps or other barrier devices and make them less effective as birth control. Do not cover the treated area with an airtight bandage. Cotton gauze dressings can be used. Cotton underwear can   be worn after using this medicine on the genital or anal area.  Actinic keratoses that were not seen before may appear during treatment and may later go away. The treatment area and surrounding area may lighten or darken after treatment with this medicine. These skin color changes may be permanent in some patients.  If you experience a skin reaction at the treatment site that interferes or prevents you from doing any daily activity, contact your health care provider. You may need a rest period from treatment.  Treatment may be restarted once the reaction has gotten better as recommended by your doctor or health care professional.  This medicine can make you more sensitive to the sun. Keep out of the sun. If you cannot avoid being in the sun, wear protective clothing and use sunscreen. Do not use sun lamps or tanning beds/booths.  What side effects may I notice from receiving this medicine?  Side effects that you should report to your doctor or health care professional as soon as possible:  -open sores with or without drainage  -skin infection  -skin rash  -unusual or severe skin reaction  Side effects that usually do not require medical attention (report to your doctor or health care professional if they continue or are bothersome):  -burning or itching  -redness of the skin (very common but is usually not painful or harmful)  -scabbing, crusting, or peeling skin  -skin that becomes hard or thickened  -swelling of the skin  This list may not describe all possible side effects. Call your doctor for medical advice about side effects. You may report side effects to FDA at 1-800-FDA-1088.  Where should I keep my medicine?  Keep out of the reach of children.  Store between 4 and 25 degrees C (39 and 77 degrees F). Do not freeze. Throw away any unused medicine after the expiration date. Discard packet after applying to affected area. Partial packets should not be saved or reused.  NOTE: This sheet is a summary. It may not cover all possible information. If you have questions about this medicine, talk to your doctor, pharmacist, or health care provider.   2018 Elsevier/Gold Standard (2008-06-08 10:33:25)

## 2016-10-24 NOTE — Progress Notes (Signed)
This is a 51 year old Caucasian/White female who presents for the evaluation of vulvar lesion(s). She describes the vulvar lesion(s) as white, raised. She indicates that she has noticed 2 lesion that the average approximately 0.2 centimeter to 0.8 centimeters in size.  She indicates she first noticed the problem 1 month ago. She admits to symptoms of itching, burning, irritation which she states are moderate.  The following aggravating factors are identified:physical activity. The following alleviating factors are identified: Vinegar soln.  She has not had a previous colposcopy for this condition. The lesion has not been biopsied. She has not had any previous treatment for this condition.  Recent stress (daughter).  Not currently sexually active.  In a relationship for >1 year, waiting for marriage (indeterminate).  PMHx: She  has a past medical history of Allergic rhinitis; Anxiety; Arthritis; Arthritis; Bipolar affective disorder (HCC); Bunion of right foot; Depression; GERD (gastroesophageal reflux disease); and Wears contact lenses. Also,  has a past surgical history that includes Bunionectomy; Hallux valgus [place] (Right, 02/09/2015); Colonoscopy (N/A, 05/04/2016); and Esophagogastroduodenoscopy (egd) with propofol (05/04/2016)., family history includes Breast cancer in her paternal aunt; Heart failure in her mother; Hypertension in her father and mother; Rheum arthritis in her mother.,  reports that she has never smoked. She has never used smokeless tobacco. She reports that she drinks about 1.8 oz of alcohol per week .  She has a current medication list which includes the following prescription(s): montelukast, divalproex, fluticasone, imiquimod, lithium carbonate, mometasone, polyethylene glycol powder, and zaleplon. Also, has No Known Allergies.  Review of Systems  Constitutional: Negative for chills, fever and malaise/fatigue.  HENT: Negative for congestion, sinus pain and sore throat.     Eyes: Negative for blurred vision and pain.  Respiratory: Negative for cough and wheezing.   Cardiovascular: Negative for chest pain and leg swelling.  Gastrointestinal: Negative for abdominal pain, constipation, diarrhea, heartburn, nausea and vomiting.  Genitourinary: Negative for dysuria, frequency, hematuria and urgency.  Musculoskeletal: Negative for back pain, joint pain, myalgias and neck pain.  Skin: Negative for itching and rash.  Neurological: Negative for dizziness, tremors and weakness.  Endo/Heme/Allergies: Does not bruise/bleed easily.  Psychiatric/Behavioral: Negative for depression. The patient is not nervous/anxious and does not have insomnia.     Objective: BP 110/70   Pulse 63   Ht  (1.676 m)   Wt 149 lb (67.6 kg)   BMI 24.05 kg/m  Physical Exam  Constitutional: She is oriented to person, place, and time. She appears well-developed and well-nourished. No distress.  Genitourinary: Vagina normal. Pelvic exam was performed with patient supine. There is no rash, tenderness or lesion on the right labia. There is no rash, tenderness or lesion on the left labia.    No erythema or bleeding in the vagina.  Genitourinary Comments: 2 small warty raised lesions  Abdominal: Soft. She exhibits no distension. There is no tenderness.  Musculoskeletal: Normal range of motion.  Neurological: She is alert and oriented to person, place, and time. No cranial nerve deficit.  Skin: Skin is warm and dry.  Psychiatric: She has a normal mood and affect.    ASSESSMENT/PLAN:    Problem List Items Addressed This Visit      Musculoskeletal and Integument   Condyloma acuminata - Primary   Relevant Medications   imiquimod (ALDARA) 5 % cream    Other tx options discussed. Stress induced a likely etiology.  Annamarie Major, MD, [facility], [facility] 10/24/2016  12:27  PM

## 2017-01-14 ENCOUNTER — Encounter: Payer: Self-pay | Admitting: *Deleted

## 2017-01-14 ENCOUNTER — Ambulatory Visit
Admission: EM | Admit: 2017-01-14 | Discharge: 2017-01-14 | Disposition: A | Discharge status: Home / Self Care | Payer: BLUE CROSS/BLUE SHIELD | Attending: Family Medicine | Admitting: Family Medicine

## 2017-01-14 DIAGNOSIS — B9689 Other specified bacterial agents as the cause of diseases classified elsewhere: Secondary | ICD-10-CM | POA: Diagnosis not present

## 2017-01-14 DIAGNOSIS — N760 Acute vaginitis: Secondary | ICD-10-CM

## 2017-01-14 DIAGNOSIS — N898 Other specified noninflammatory disorders of vagina: Secondary | ICD-10-CM | POA: Diagnosis not present

## 2017-01-14 DIAGNOSIS — B373 Candidiasis of vulva and vagina: Secondary | ICD-10-CM

## 2017-01-14 DIAGNOSIS — B3731 Acute candidiasis of vulva and vagina: Secondary | ICD-10-CM

## 2017-01-14 LAB — URINALYSIS, COMPLETE (UACMP) WITH MICROSCOPIC
Bilirubin Urine: NEGATIVE
Glucose, UA: NEGATIVE mg/dL
Hgb urine dipstick: NEGATIVE
Ketones, ur: NEGATIVE mg/dL
Leukocytes, UA: NEGATIVE
Nitrite: NEGATIVE
Protein, ur: NEGATIVE mg/dL
Specific Gravity, Urine: 1.015 (ref 1.005–1.030)
pH: 6.5 (ref 5.0–8.0)

## 2017-01-14 LAB — WET PREP, GENITAL
Sperm: NONE SEEN
Trich, Wet Prep: NONE SEEN

## 2017-01-14 LAB — CHLAMYDIA/NGC RT PCR (ARMC ONLY)
Chlamydia Tr: NOT DETECTED
N gonorrhoeae: NOT DETECTED

## 2017-01-14 MED ORDER — FLUCONAZOLE 150 MG PO TABS: 150.0000 mg | ORAL_TABLET | Freq: Every day | ORAL | 0 refills | Status: DC

## 2017-01-14 MED ORDER — VALACYCLOVIR HCL 1 G PO TABS: 1000.0000 mg | ORAL_TABLET | Freq: Two times a day (BID) | ORAL | 0 refills | Status: DC

## 2017-01-14 MED ORDER — METRONIDAZOLE 500 MG PO TABS: 500.0000 mg | ORAL_TABLET | Freq: Two times a day (BID) | ORAL | 0 refills | Status: DC

## 2017-01-14 NOTE — ED Provider Notes (Signed)
[facility]    CSN: [id number] Arrival date & time: 01/14/17  1700     History   Chief Complaint Chief Complaint  Patient presents with  . Vaginal Discharge    HPI Elwanda Moger is a 51 y.o. female.   51 yo female with a c/o vulvar pain and skin lesions for the past 4-7 days. Patient states that she was using a cream for genital warts that she's used before without any problems. Patient is wondering if she developed a reaction to the cream because now she has developed some other tender skin lesions and redness. States she's also had vaginal discharge. Denies any fevers or chills.    The history is provided by the patient.  Vaginal Discharge    Past Medical History:  Diagnosis Date  . Allergic rhinitis    SINUSITIS  . Anxiety   . Arthritis    feet, knees  . Arthritis   . Bipolar affective disorder (HCC)    [facility]  . Bunion of right foot   . Depression   . GERD (gastroesophageal reflux disease)   . Wears contact lenses    one eye only    Patient Active Problem List   Diagnosis Date Noted  . Condyloma acuminata 10/24/2016    Past Surgical History:  Procedure Laterality Date  . BUNIONECTOMY    . COLONOSCOPY N/A 05/04/2016   Procedure: COLONOSCOPY;  Surgeon: Christena Deem, MD;  Location: [facility] ENDOSCOPY;  Service: Endoscopy;  Laterality: N/A;  . ESOPHAGOGASTRODUODENOSCOPY (EGD) WITH PROPOFOL  05/04/2016   Procedure: ESOPHAGOGASTRODUODENOSCOPY (EGD) WITH PROPOFOL;  Surgeon: Christena Deem, MD;  Location: [facility] ENDOSCOPY;  Service: Endoscopy;;  . Claudia Pollock [place] Right 02/09/2015   Procedure: HALLUX VALGUS [place];  Surgeon: Gwyneth Revels, DPM;  Location[facility];  Service: Podiatry;  Laterality: Right;  POPLITEAL    OB History    Gravida Para Term Preterm AB Living   1 1 1     1    SAB TAB Ectopic Multiple Live Births                   Home Medications    Prior to Admission medications     Medication Sig Start Date End Date Taking? Authorizing Provider  divalproex (DEPAKOTE ER) 500 MG 24 hr tablet Take 1,000 mg by mouth.    Yes [provider]  imiquimod (ALDARA) 5 % cream Apply topically 3 (three) times a week. Apply until total clearance or maximum of 16 weeks 10/24/16  Yes Nadara Mustard, MD  ipratropium (ATROVENT) 0.03 % nasal spray Place 2 sprays into both nostrils daily.   Yes [provider]  lithium carbonate 300 MG capsule Take 600 mg by mouth daily.   Yes [provider]  montelukast (SINGULAIR) 10 MG tablet TAKE 1 TABLET NIGHTLY 10/28/15  Yes [provider]  polyethylene glycol powder (GLYCOLAX/MIRALAX) powder Take by mouth.   Yes [provider]  zaleplon (SONATA) 10 MG capsule  07/17/16  Yes [provider]  fluconazole (DIFLUCAN) 150 MG tablet Take 1 tablet (150 mg total) by mouth daily. 01/14/17   Payton Mccallum, MD  fluticasone (FLONASE) 50 MCG/ACT nasal spray by Nasal route.    [provider]  lithium carbonate (ESKALITH) 450 MG CR tablet  10/15/16   [provider]  metroNIDAZOLE (FLAGYL) 500 MG tablet Take 1 tablet (500 mg total) by mouth 2 (two) times daily. 01/14/17   Payton Mccallum, MD  mometasone (ELOCON) 0.1 % lotion  08/15/16   [provider]  valACYclovir (VALTREX) 1000 MG tablet Take 1 tablet (1,000 mg total) by mouth 2 (two) times daily. 01/14/17   Payton Mccallum, MD    Family History Family History  Problem Relation Age of Onset  . Breast cancer Paternal Aunt   . Hypertension Mother   . Heart failure Mother   . Rheum arthritis Mother   . Hypertension Father     Social History Social History  Substance Use Topics  . Smoking status: Never Smoker  . Smokeless tobacco: Never Used  . Alcohol use 1.8 oz/week    3 Glasses of wine per week     Allergies   Patient has no known allergies.   Review of Systems Review of Systems  Genitourinary: Positive for vaginal  discharge.     Physical Exam Triage Vital Signs ED Triage Vitals  Enc Vitals Group     BP 01/14/17 1715 113/73     Pulse Rate 01/14/17 1715 64     Resp 01/14/17 1715 16     Temp 01/14/17 1715 98.9 F (37.2 C)     Temp Source 01/14/17 1715 Oral     SpO2 01/14/17 1715 100 %     Weight 01/14/17 1716 154 lb (69.9 kg)     Height 01/14/17 1716 5\' 6"  (1.676 m)     Head Circumference --      Peak Flow --      Pain Score 01/14/17 1716 3     Pain Loc --      Pain Edu? --      Excl. in GC? --    No data found.   Updated Vital Signs BP 113/73 (BP Location: Left Arm)   Pulse 64   Temp 98.9 F (37.2 C) (Oral)   Resp 16   Ht 5\' 6"  (1.676 m)   Wt 154 lb (69.9 kg)   SpO2 100%   BMI 24.86 kg/m   Visual Acuity Right Eye Distance:   Left Eye Distance:   Bilateral Distance:    Right Eye Near:   Left Eye Near:    Bilateral Near:     Physical Exam  Constitutional: She appears well-developed and well-nourished. No distress.  Genitourinary:    Pelvic exam was performed with patient supine. There is rash, tenderness and lesion on the right labia. Cervix exhibits no discharge and no friability. No erythema or tenderness in the vagina. No signs of injury around the vagina. Vaginal discharge found.  Genitourinary Comments: External 2mm superficial ulcerations to right labia noted with mild surrounding erythema  Skin: She is not diaphoretic.  Nursing note and vitals reviewed.    UC Treatments / Results  Labs (all labs ordered are listed, but only abnormal results are displayed) Labs Reviewed  WET PREP, GENITAL - Abnormal; Notable for the following:       Result Value   Yeast Wet Prep HPF POC PRESENT (*)    Clue Cells Wet Prep HPF POC PRESENT (*)    WBC, Wet Prep HPF POC MANY (*)    All other components within normal limits  URINALYSIS, COMPLETE (UACMP) WITH MICROSCOPIC - Abnormal; Notable for the following:    Squamous Epithelial / LPF 0-5 (*)    Bacteria, UA RARE (*)     All other components within normal limits  HSV CULTURE AND TYPING  CHLAMYDIA/NGC RT PCR [facility] ONLY)    EKG  EKG Interpretation None  Radiology No results found.  Procedures Procedures (including critical care time)  Medications Ordered in UC Medications - No data to display   Initial Impression / Assessment and Plan / UC Course  I have reviewed the triage vital signs and the nursing notes.  Pertinent labs & imaging results that were available during my care of the patient were reviewed by me and considered in my medical decision making (see chart for details).       Final Clinical Impressions(s) / UC Diagnoses   Final diagnoses:  Vaginal lesion  BV (bacterial vaginosis)  Vaginal yeast infection    New Prescriptions Discharge Medication List as of 01/14/2017  6:41 PM    START taking these medications   Details  fluconazole (DIFLUCAN) 150 MG tablet Take 1 tablet (150 mg total) by mouth daily., Starting Mon 01/14/2017, Normal    metroNIDAZOLE (FLAGYL) 500 MG tablet Take 1 tablet (500 mg total) by mouth 2 (two) times daily., Starting Mon 01/14/2017, Normal    valACYclovir (VALTREX) 1000 MG tablet Take 1 tablet (1,000 mg total) by mouth 2 (two) times daily., Starting Mon 01/14/2017, Normal       1. Lab results and diagnosis reviewed with patient 2. rx as per orders above; reviewed possible side effects, interactions, risks and benefits  3. Await other tests/cultures and further management pending test results 4.Recommend patient stop cream for genital warts at this time 5. Follow-up prn if symptoms worsen or don't improve   Payton Mccallumonty, Baley Lorimer, MD 01/14/17 2016

## 2017-01-14 NOTE — ED Triage Notes (Signed)
Patient started having pelvic pain about one week ago. 4 days ago patient started using cream for genital warts causing the external genitalia to become irritated.

## 2017-01-17 LAB — HSV CULTURE AND TYPING

## 2017-01-18 ENCOUNTER — Telehealth: Payer: Self-pay | Admitting: *Deleted

## 2017-01-18 NOTE — Telephone Encounter (Signed)
Patient called requesting lab results. Communicated negative chlamydia, gonorrhea, and HSV results. As per Dr Judd Gaudieronty, patient directed to stop taking antiviral and continue taking antibiotic. Patient reported improvement. Advised patient to follow up with PCP or [facility] if symptoms persist.

## 2017-01-25 ENCOUNTER — Other Ambulatory Visit: Payer: Self-pay | Admitting: Obstetrics & Gynecology

## 2017-01-25 DIAGNOSIS — Z1231 Encounter for screening mammogram for malignant neoplasm of breast: Secondary | ICD-10-CM

## 2017-04-04 ENCOUNTER — Other Ambulatory Visit: Payer: Self-pay | Admitting: Obstetrics & Gynecology

## 2017-04-04 DIAGNOSIS — Z1231 Encounter for screening mammogram for malignant neoplasm of breast: Secondary | ICD-10-CM

## 2017-05-02 ENCOUNTER — Ambulatory Visit
Admission: RE | Admit: 2017-05-02 | Discharge: 2017-05-02 | Disposition: A | Discharge status: Home / Self Care | Payer: BLUE CROSS/BLUE SHIELD | Source: Ambulatory Visit | Attending: Obstetrics & Gynecology | Admitting: Obstetrics & Gynecology

## 2017-05-02 DIAGNOSIS — Z1231 Encounter for screening mammogram for malignant neoplasm of breast: Secondary | ICD-10-CM | POA: Insufficient documentation

## 2017-05-03 ENCOUNTER — Encounter: Payer: Self-pay | Admitting: Obstetrics & Gynecology

## 2017-06-11 ENCOUNTER — Telehealth: Payer: Self-pay

## 2017-06-11 DIAGNOSIS — A630 Anogenital (venereal) warts: Secondary | ICD-10-CM

## 2017-06-11 MED ORDER — IMIQUIMOD 5 % EX CREA: TOPICAL_CREAM | CUTANEOUS | 3 refills | Status: DC

## 2017-06-11 NOTE — Telephone Encounter (Signed)
Please advise if you want me to refill her Aldara cream

## 2017-06-11 NOTE — Telephone Encounter (Signed)
Called pt no answer mailbox full need to know is it the cream [facility] prescribed

## 2017-06-11 NOTE — Telephone Encounter (Signed)
Pt needs a refill but not sure the name of med. She thru away package after she finished the last one. [phone]

## 2017-06-11 NOTE — Telephone Encounter (Signed)
Approved I eRx it

## 2017-08-26 ENCOUNTER — Telehealth: Payer: Self-pay

## 2017-08-26 MED ORDER — METRONIDAZOLE 500 MG PO TABS: 500.0000 mg | ORAL_TABLET | Freq: Two times a day (BID) | ORAL | 1 refills | Status: DC

## 2017-08-26 NOTE — Telephone Encounter (Signed)
Yes

## 2017-08-26 NOTE — Telephone Encounter (Signed)
Can you send in rx for bv for her?

## 2017-08-26 NOTE — Telephone Encounter (Signed)
Pt states she has BV, thinks she was tx'd before c flagyl.  Let her know what she was tx'd c so she can contact pharm for refill. [phone] Called pt to let her know she needs to be seen b/c we haven't seen her since April.  Pt states PH gave her a rx to keep on hand.  She has lost this rx and that's why she is calling.  Has a very distinct odor, knows what it is, is an [employer], works [employer], has root canal on [employer]. So it will be hard for her to come in.  Adv I could send msg and see.  Msg sent per pt.

## 2018-02-04 ENCOUNTER — Other Ambulatory Visit (HOSPITAL_COMMUNITY)
Admission: RE | Admit: 2018-02-04 | Discharge: 2018-02-04 | Disposition: A | Discharge status: Home / Self Care | Payer: BLUE CROSS/BLUE SHIELD | Source: Ambulatory Visit | Attending: Obstetrics & Gynecology | Admitting: Obstetrics & Gynecology

## 2018-02-04 ENCOUNTER — Encounter: Payer: Self-pay | Admitting: Obstetrics & Gynecology

## 2018-02-04 ENCOUNTER — Ambulatory Visit (INDEPENDENT_AMBULATORY_CARE_PROVIDER_SITE_OTHER): Payer: BLUE CROSS/BLUE SHIELD | Admitting: Obstetrics & Gynecology

## 2018-02-04 VITALS — BP 120/80 | Ht 66.0 in | Wt 153.0 lb

## 2018-02-04 DIAGNOSIS — Z01411 Encounter for gynecological examination (general) (routine) with abnormal findings: Secondary | ICD-10-CM | POA: Diagnosis not present

## 2018-02-04 DIAGNOSIS — Z1231 Encounter for screening mammogram for malignant neoplasm of breast: Secondary | ICD-10-CM

## 2018-02-04 DIAGNOSIS — Z124 Encounter for screening for malignant neoplasm of cervix: Secondary | ICD-10-CM | POA: Diagnosis present

## 2018-02-04 DIAGNOSIS — Z1239 Encounter for other screening for malignant neoplasm of breast: Secondary | ICD-10-CM

## 2018-02-04 DIAGNOSIS — Z0000 Encounter for general adult medical examination without abnormal findings: Secondary | ICD-10-CM

## 2018-02-04 DIAGNOSIS — K5900 Constipation, unspecified: Secondary | ICD-10-CM

## 2018-02-04 DIAGNOSIS — Z780 Asymptomatic menopausal state: Secondary | ICD-10-CM | POA: Insufficient documentation

## 2018-02-04 DIAGNOSIS — Z30432 Encounter for removal of intrauterine contraceptive device: Secondary | ICD-10-CM | POA: Diagnosis not present

## 2018-02-04 DIAGNOSIS — N9089 Other specified noninflammatory disorders of vulva and perineum: Secondary | ICD-10-CM

## 2018-02-04 NOTE — Progress Notes (Signed)
HPI:      [name] is a 52 y.o. G1P1001 who LMP was No LMP recorded. (Menstrual status: IUD)., she presents today for her annual examination. The patient has no complaints today. The patient is not currently sexually active. Her last pap: was normal and last mammogram: approximate date 2018 and was normal. The patient does perform self breast exams.  There is no notable family history of breast or ovarian cancer in her family.  The patient has regular exercise: yes.  The patient denies current symptoms of depression.    GYN History: Contraception: IUD  Colonoscopy 2018  PMHx: Past Medical History:  Diagnosis Date  . Allergic rhinitis    SINUSITIS  . Anxiety   . Arthritis    feet, knees  . Arthritis   . Bipolar affective disorder (HCC)    [facility]  . Bunion of right foot   . Depression   . GERD (gastroesophageal reflux disease)   . Wears contact lenses    one eye only   Past Surgical History:  Procedure Laterality Date  . BUNIONECTOMY    . COLONOSCOPY N/A 05/04/2016   Procedure: COLONOSCOPY;  Surgeon: Christena Deem, MD;  Location: [facility] ENDOSCOPY;  Service: Endoscopy;  Laterality: N/A;  . ESOPHAGOGASTRODUODENOSCOPY (EGD) WITH PROPOFOL  05/04/2016   Procedure: ESOPHAGOGASTRODUODENOSCOPY (EGD) WITH PROPOFOL;  Surgeon: Christena Deem, MD;  Location: [facility] ENDOSCOPY;  Service: Endoscopy;;  . FOOT SURGERY    . HALLUX VALGUS AUSTIN Right 02/09/2015   Procedure: HALLUX VALGUS AUSTIN;  Surgeon: Gwyneth Revels, DPM;  Location: [facility];  Service: Podiatry;  Laterality: Right;  POPLITEAL   Family History  Problem Relation Age of Onset  . Breast cancer Paternal Aunt   . Hypertension Mother   . Heart failure Mother   . Rheum arthritis Mother   . Hypertension Father    Social History   Tobacco Use  . Smoking status: Never Smoker  . Smokeless tobacco: Never Used  Substance Use Topics  . Alcohol use: Yes    Alcohol/week: 1.8 oz    Types: 3 Glasses of wine per week  . Drug use: Not on file    Current Outpatient Medications:  .  divalproex (DEPAKOTE ER) 500 MG 24 hr tablet, Take 1,000 mg by mouth. , Disp: , Rfl:  .  fluticasone (FLONASE) 50 MCG/ACT nasal spray, by Nasal route., Disp: , Rfl:  .  imiquimod (ALDARA) 5 % cream, Apply topically 3 (three) times a week. Apply until total clearance or maximum of 16 weeks, Disp: 24 each, Rfl: 3 .  ipratropium (ATROVENT) 0.03 % nasal spray, Place 2 sprays into both nostrils daily., Disp: , Rfl:  .  lithium carbonate (ESKALITH) 450 MG CR tablet, , Disp: , Rfl:  .  lithium carbonate 300 MG capsule, Take 600 mg by mouth daily., Disp: , Rfl:  .  metroNIDAZOLE (FLAGYL) 500 MG tablet, Take 1 tablet (500 mg total) by mouth 2 (two) times daily., Disp: 14 tablet, Rfl: 1 .  mometasone (ELOCON) 0.1 % lotion, , Disp: , Rfl:  .  montelukast (SINGULAIR) 10 MG tablet, TAKE 1 TABLET NIGHTLY, Disp: , Rfl:  .  polyethylene glycol powder (GLYCOLAX/MIRALAX) powder, Take by mouth., Disp: , Rfl:  .  valACYclovir (VALTREX) 1000 MG tablet, Take 1 tablet (1,000 mg total) by mouth 2 (two) times daily., Disp: 14 tablet, Rfl: 0 .  zaleplon (SONATA) 10 MG capsule, , Disp: , Rfl:  .  fluconazole (DIFLUCAN) 150  MG tablet, Take 1 tablet (150 mg total) by mouth daily. (Patient not taking: Reported on 02/04/2018), Disp: 1 tablet, Rfl: 0 Allergies: Patient has no known allergies.  Review of Systems  Constitutional: Negative for chills, fever and malaise/fatigue.  HENT: Positive for congestion. Negative for sinus pain and sore throat.   Eyes: Negative for blurred vision and pain.  Respiratory: Negative for cough and wheezing.   Cardiovascular: Negative for chest pain and leg swelling.  Gastrointestinal: Positive for constipation. Negative for abdominal pain, diarrhea, heartburn, nausea and vomiting.  Genitourinary: Negative for dysuria, frequency, hematuria and urgency.  Musculoskeletal: Positive for joint pain.  Negative for back pain, myalgias and neck pain.  Skin: Negative for itching and rash.  Neurological: Negative for dizziness, tremors and weakness.  Endo/Heme/Allergies: Does not bruise/bleed easily.  Psychiatric/Behavioral: Negative for depression. The patient is not nervous/anxious and does not have insomnia.     Objective: BP 120/80   Ht 5\' 6"  (1.676 m)   Wt 153 lb (69.4 kg)   BMI 24.69 kg/m   Filed Weights   02/04/18 0932  Weight: 153 lb (69.4 kg)   Body mass index is 24.69 kg/m. Physical Exam  Constitutional: She is oriented to person, place, and time. She appears well-developed and well-nourished. No distress.  Genitourinary: Rectum normal, vagina normal and uterus normal. Pelvic exam was performed with patient supine. There is no rash or lesion on the right labia. There is no rash or lesion on the left labia. Vagina exhibits no lesion. No bleeding in the vagina. Right adnexum does not display mass and does not display tenderness. Left adnexum does not display mass and does not display tenderness. Cervix does not exhibit motion tenderness, lesion, friability or polyp.   Uterus is mobile and midaxial. Uterus is not enlarged or exhibiting a mass.  Genitourinary Comments: Small right labial skin tag, no warts  HENT:  Head: Normocephalic and atraumatic. Head is without laceration.  Right Ear: Hearing normal.  Left Ear: Hearing normal.  Nose: No epistaxis.  No foreign bodies.  Mouth/Throat: Uvula is midline, oropharynx is clear and moist and mucous membranes are normal.  Eyes: Pupils are equal, round, and reactive to light.  Neck: Normal range of motion. Neck supple. No thyromegaly present.  Cardiovascular: Normal rate and regular rhythm. Exam reveals no gallop and no friction rub.  No murmur heard. Pulmonary/Chest: Effort normal and breath sounds normal. No respiratory distress. She has no wheezes. Right breast exhibits no mass, no skin change and no tenderness. Left breast  exhibits no mass, no skin change and no tenderness.  Abdominal: Soft. Bowel sounds are normal. She exhibits no distension. There is no tenderness. There is no rebound.  Musculoskeletal: Normal range of motion.  Neurological: She is alert and oriented to person, place, and time. No cranial nerve deficit.  Skin: Skin is warm and dry.  Psychiatric: She has a normal mood and affect. Judgment normal.  Vitals reviewed.  Assessment:  ANNUAL EXAM 1. Annual physical exam   2. Screening for cervical cancer   3. Screening for breast cancer   4. Menopause   5. Encounter for IUD removal    Screening Plan:            1.  Cervical Screening-  Pap smear done today  2. Breast screening- Exam annually and mammogram>40 planned   3. Colonoscopy every 10 years, Hemoccult testing - after age 53  4. Labs managed by PCP  5. Counseling for contraception: IUD removed today. Check  levels in one month.  Unlikely fertility.   6. Menopause - FSH/LH; Future - Estradiol; Future  5. Encounter for IUD removal  Rebecca Pena is a 52 y.o. that had a Mirena IUD placed approximately 5 years ago. Since that time, she states that she has no periods and does well with IUD.  Concerned for pregnancy at her age. Pelvic exam:  Two IUD strings present seen coming from the cervical os. EGBUS, vaginal vault and cervix: within normal limits IUD Removal Strings of IUD identified and grasped.  IUD removed without problem.  Pt tolerated this well.  IUD noted to be intact. Assessment: IUD Removal Plan: IUD removed and plan for contraception is no method. She was amenable to this plan.    F/U  Return in about 1 year (around 02/05/2019) for Annual.  Annamarie Major, MD, [facility], [facility] 02/04/2018  10:00 AM

## 2018-02-04 NOTE — Patient Instructions (Signed)
PAP every three years Mammogram every year    Call [phone] to schedule at [facility] yearly (with PCP)   

## 2018-02-05 LAB — CYTOLOGY - PAP
Diagnosis: NEGATIVE
HPV: NOT DETECTED

## 2018-05-05 ENCOUNTER — Other Ambulatory Visit: Payer: Self-pay | Admitting: Obstetrics & Gynecology

## 2018-05-05 ENCOUNTER — Other Ambulatory Visit: Payer: BLUE CROSS/BLUE SHIELD

## 2018-05-05 DIAGNOSIS — Z780 Asymptomatic menopausal state: Secondary | ICD-10-CM

## 2018-05-05 DIAGNOSIS — Z1231 Encounter for screening mammogram for malignant neoplasm of breast: Secondary | ICD-10-CM

## 2018-05-06 LAB — FSH/LH
FSH: 111.5 m[IU]/mL
LH: 46.9 m[IU]/mL

## 2018-05-06 LAB — SPECIMEN STATUS REPORT

## 2018-05-06 LAB — ESTRADIOL: Estradiol: 11.4 pg/mL

## 2018-05-23 ENCOUNTER — Ambulatory Visit
Admission: RE | Admit: 2018-05-23 | Discharge: 2018-05-23 | Disposition: A | Discharge status: Home / Self Care | Payer: BLUE CROSS/BLUE SHIELD | Source: Ambulatory Visit | Attending: Obstetrics & Gynecology | Admitting: Obstetrics & Gynecology

## 2018-05-23 ENCOUNTER — Encounter: Payer: Self-pay | Admitting: Obstetrics & Gynecology

## 2018-05-23 DIAGNOSIS — Z1231 Encounter for screening mammogram for malignant neoplasm of breast: Secondary | ICD-10-CM | POA: Insufficient documentation

## 2019-05-12 ENCOUNTER — Ambulatory Visit: Payer: Self-pay | Admitting: Obstetrics & Gynecology

## 2019-05-19 ENCOUNTER — Other Ambulatory Visit: Payer: Self-pay

## 2019-05-19 ENCOUNTER — Ambulatory Visit (INDEPENDENT_AMBULATORY_CARE_PROVIDER_SITE_OTHER): Payer: BC Managed Care – PPO | Admitting: Obstetrics & Gynecology

## 2019-05-19 ENCOUNTER — Encounter: Payer: Self-pay | Admitting: Obstetrics & Gynecology

## 2019-05-19 VITALS — BP 130/80 | Ht 66.0 in | Wt 166.0 lb

## 2019-05-19 DIAGNOSIS — Z01419 Encounter for gynecological examination (general) (routine) without abnormal findings: Secondary | ICD-10-CM | POA: Diagnosis not present

## 2019-05-19 DIAGNOSIS — Z1211 Encounter for screening for malignant neoplasm of colon: Secondary | ICD-10-CM

## 2019-05-19 DIAGNOSIS — Z1231 Encounter for screening mammogram for malignant neoplasm of breast: Secondary | ICD-10-CM

## 2019-05-19 NOTE — Progress Notes (Signed)
HPI:      [name] is a 53 y.o. G1P1001 who LMP was in the past (> 1 yr ago), she presents today for her annual examination.  The patient has no complaints today. The patient is sexually active. Herlast pap: approximate date 2019 and was normal and last mammogram: approximate date 2019 and was normal.  The patient does perform self breast exams.  There is no notable family history of breast or ovarian cancer in her family. The patient is not taking hormone replacement therapy. Patient denies post-menopausal vaginal bleeding.   The patient has regular exercise: yes. The patient denies current symptoms of depression.    GYN Hx: Last Colonoscopy:3 years ago. Normal.   PMHx: Past Medical History:  Diagnosis Date  . Allergic rhinitis    SINUSITIS  . Anxiety   . Arthritis    feet, knees  . Arthritis   . Bipolar affective disorder ([place])    [facility] Hol[facility]  . Bunion of right foot   . Depression   . GERD (gastroesophageal reflux disease)   . Wears contact lenses    one eye only   Past Surgical History:  Procedure Laterality Date  . BUNIONECTOMY    . COLONOSCOPY N/A 05/04/2016   Procedure: COLONOSCOPY;  Surgeon: Lollie Sails, MD;  Location: [facility] ENDOSCOPY;  Service: Endoscopy;  Laterality: N/A;  . ESOPHAGOGASTRODUODENOSCOPY (EGD) WITH PROPOFOL  05/04/2016   Procedure: ESOPHAGOGASTRODUODENOSCOPY (EGD) WITH PROPOFOL;  Surgeon: Lollie Sails, MD;  Location: [facility] ENDOSCOPY;  Service: Endoscopy;;  . FOOT SURGERY    . HALLUX VALGUS AUSTIN Right 02/09/2015   Procedure: HALLUX VALGUS AUSTIN;  Surgeon: Samara Deist, DPM;  Location: [facility];  Service: Podiatry;  Laterality: Right;  POPLITEAL   Family History  Problem Relation Age of Onset  . Breast cancer Paternal Aunt   . Hypertension Mother   . Heart failure Mother   . Rheum arthritis Mother   . Hypertension Father    Social History   Tobacco Use  . Smoking status: Never Smoker  .  Smokeless tobacco: Never Used  Substance Use Topics  . Alcohol use: Yes    Alcohol/week: 3.0 standard drinks    Types: 3 Glasses of wine per week  . Drug use: Not on file    Current Outpatient Medications:  .  divalproex (DEPAKOTE ER) 500 MG 24 hr tablet, Take 1,000 mg by mouth. , Disp: , Rfl:  .  fluticasone (FLONASE) 50 MCG/ACT nasal spray, by Nasal route., Disp: , Rfl:  .  ipratropium (ATROVENT) 0.03 % nasal spray, Place 2 sprays into both nostrils daily., Disp: , Rfl:  .  lithium carbonate (ESKALITH) 450 MG CR tablet, , Disp: , Rfl:  .  lithium carbonate 300 MG capsule, Take 600 mg by mouth daily., Disp: , Rfl:  .  propranolol (INDERAL) 20 MG tablet, , Disp: , Rfl:  .  fluconazole (DIFLUCAN) 150 MG tablet, Take 1 tablet (150 mg total) by mouth daily. (Patient not taking: Reported on 02/04/2018), Disp: 1 tablet, Rfl: 0 .  imiquimod (ALDARA) 5 % cream, Apply topically 3 (three) times a week. Apply until total clearance or maximum of 16 weeks (Patient not taking: Reported on 05/19/2019), Disp: 24 each, Rfl: 3 .  metroNIDAZOLE (FLAGYL) 500 MG tablet, Take 1 tablet (500 mg total) by mouth 2 (two) times daily. (Patient not taking: Reported on 05/19/2019), Disp: 14 tablet, Rfl: 1 .  mometasone (ELOCON) 0.1 % lotion, , Disp: , Rfl:  .  montelukast (SINGULAIR) 10 MG tablet, TAKE 1 TABLET NIGHTLY, Disp: , Rfl:  .  polyethylene glycol powder (GLYCOLAX/MIRALAX) powder, Take by mouth., Disp: , Rfl:  .  valACYclovir (VALTREX) 1000 MG tablet, Take 1 tablet (1,000 mg total) by mouth 2 (two) times daily. (Patient not taking: Reported on 05/19/2019), Disp: 14 tablet, Rfl: 0 .  zaleplon (SONATA) 10 MG capsule, , Disp: , Rfl:  Allergies: Patient has no known allergies.  Review of Systems  Constitutional: Negative for chills, fever and malaise/fatigue.  HENT: Negative for congestion, sinus pain and sore throat.   Eyes: Negative for blurred vision and pain.  Respiratory: Negative for cough and wheezing.    Cardiovascular: Negative for chest pain and leg swelling.  Gastrointestinal: Negative for abdominal pain, constipation, diarrhea, heartburn, nausea and vomiting.  Genitourinary: Negative for dysuria, frequency, hematuria and urgency.  Musculoskeletal: Negative for back pain, joint pain, myalgias and neck pain.  Skin: Negative for itching and rash.  Neurological: Negative for dizziness, tremors and weakness.  Endo/Heme/Allergies: Does not bruise/bleed easily.  Psychiatric/Behavioral: Negative for depression. The patient is not nervous/anxious and does not have insomnia.     Objective: BP 130/80   Ht 5\' 6"  (1.676 m)   Wt 166 lb (75.3 kg)   BMI 26.79 kg/m   Filed Weights   05/19/19 1046  Weight: 166 lb (75.3 kg)   Body mass index is 26.79 kg/m. Physical Exam Constitutional:      General: She is not in acute distress.    Appearance: She is well-developed.  Genitourinary:     Pelvic exam was performed with patient supine.     Vagina, uterus and rectum normal.     No lesions in the vagina.     No vaginal bleeding.     No cervical motion tenderness, friability, lesion or polyp.     Uterus is mobile.     Uterus is not enlarged.     No uterine mass detected.    Uterus is midaxial.     No right or left adnexal mass present.     Right adnexa not tender.     Left adnexa not tender.     Genitourinary Comments: No d/c, erythema, sign of atrophy  HENT:     Head: Normocephalic and atraumatic. No laceration.     Right Ear: Hearing normal.     Left Ear: Hearing normal.     Mouth/Throat:     Pharynx: Uvula midline.  Eyes:     Pupils: Pupils are equal, round, and reactive to light.  Neck:     Musculoskeletal: Normal range of motion and neck supple.     Thyroid: No thyromegaly.  Cardiovascular:     Rate and Rhythm: Normal rate and regular rhythm.     Heart sounds: No murmur. No friction rub. No gallop.   Pulmonary:     Effort: Pulmonary effort is normal. No respiratory distress.      Breath sounds: Normal breath sounds. No wheezing.  Chest:     Breasts:        Right: No mass, skin change or tenderness.        Left: No mass, skin change or tenderness.  Abdominal:     General: Bowel sounds are normal. There is no distension.     Palpations: Abdomen is soft.     Tenderness: There is no abdominal tenderness. There is no rebound.  Musculoskeletal: Normal range of motion.  Neurological:     Mental Status: She is alert  and oriented to person, place, and time.     Cranial Nerves: No cranial nerve deficit.  Skin:    General: Skin is warm and dry.  Psychiatric:        Judgment: Judgment normal.  Vitals signs reviewed.     Assessment: Annual Exam 1. Women's annual routine gynecological examination   2. Encounter for screening mammogram for malignant neoplasm of breast   3. Screen for colon cancer     Plan:            1.  Cervical Screening-  Pap smear schedule reviewed with patient  2. Breast screening- Exam annually and mammogram scheduled  3. Colonoscopy every 10 years, Hemoccult testing after age 54  4. Labs managed by PCP  5. Counseling for hormonal therapy: none              6. FRAX - FRAX score for assessing the 10 year probability for fracture calculated and discussed today.  Based on age and score today, DEXA is not currently scheduled.   7. Boric acid for recurrent BV sx's reported No active infection today                F/U  Return in about 1 year (around 05/18/2020) for Annual.  Annamarie MajorPaul Marin Wisner, MD, [facility] FrederickFACOG [facility], [facility] 05/19/2019  11:22 AM

## 2019-05-19 NOTE — Patient Instructions (Signed)
PAP every three years Mammogram every year    Call [phone] to schedule at [facility] Colonoscopy every 10 years Labs yearly (with PCP)  Boric acid twice weekly advised  Boric Acid vaginal suppository What is this medicine? BORIC ACID (BOHR ik AS id) helps to promote the proper acid balance in the vagina. It is used to help treat yeast infections of the vagina and relieve symptoms such as itching and burning. This medicine may be used for other purposes; ask your health care provider or pharmacist if you have questions. COMMON BRAND NAME(S): Hylafem What should I tell my health care provider before I take this medicine? They need to know if you have any of these conditions:  diabetes  frequent infections  HIV or AIDS  immune system problems  an unusual or allergic reaction to boric acid, other medicines, foods, dyes, or preservatives  pregnant or trying to get pregnant  breast-feeding How should I use this medicine? This medicine is for use in the vagina. Do not take by mouth. Follow the directions on the prescription label. Read package directions carefully before using. Wash hands before and after use. Use this medicine at bedtime, unless otherwise directed by your doctor. Do not use your medicine more often than directed. Do not stop using this medicine except on your doctor's advice. Talk to your pediatrician regarding the use of this medicine in children. This medicine is not approved for use in children. Overdosage: If you think you have taken too much of this medicine contact a poison control center or emergency room at once. NOTE: This medicine is only for you. Do not share this medicine with others. What if I miss a dose? If you miss a dose, use it as soon as you can. If it is almost time for your next dose, use only that dose. Do not use double or extra doses. What may interact with this medicine? Interactions are not expected. Do not use any other vaginal products  without telling your doctor or health care professional. This list may not describe all possible interactions. Give your health care provider a list of all the medicines, herbs, non-prescription drugs, or dietary supplements you use. Also tell them if you smoke, drink alcohol, or use illegal drugs. Some items may interact with your medicine. What should I watch for while using this medicine? Tell your doctor or health care professional if your symptoms do not start to get better within a few days. It is better not to have sex until you have finished your treatment. This medicine may damage condoms or diaphragms and cause them not to work properly. It may also decrease the effect of vaginal spermicides. Do not rely on any of these methods to prevent sexually transmitted diseases or pregnancy while you are using this medicine. Vaginal medicines usually will come out of the vagina during treatment. To keep the medicine from getting on your clothing, wear a panty liner. The use of tampons is not recommended. To help clear up the infection, wear freshly washed cotton, not synthetic, underwear. What side effects may I notice from receiving this medicine? Side effects that you should report to your doctor or health care professional as soon as possible:  allergic reactions like skin rash, itching or hives  vaginal irritation, redness, or burning Side effects that usually do not require medical attention (report to your doctor or health care professional if they continue or are bothersome):  vaginal discharge This list may not describe all possible side  effects. Call your doctor for medical advice about side effects. You may report side effects to FDA at 1-800-FDA-1088. Where should I keep my medicine? Keep out of the reach of children. Store in a cool, dry place between 15 and 30 degrees C (59 and 86 degrees F). Keep away from sunlight. Throw away any unused medicine after the expiration date. NOTE: This  sheet is a summary. It may not cover all possible information. If you have questions about this medicine, talk to your doctor, pharmacist, or health care provider.  2020 Elsevier/Gold Standard (2015-07-28 07:29:58)

## 2019-06-03 ENCOUNTER — Ambulatory Visit
Admission: RE | Admit: 2019-06-03 | Discharge: 2019-06-03 | Disposition: A | Discharge status: Home / Self Care | Payer: BC Managed Care – PPO | Source: Ambulatory Visit | Attending: Obstetrics & Gynecology | Admitting: Obstetrics & Gynecology

## 2019-06-03 DIAGNOSIS — Z1231 Encounter for screening mammogram for malignant neoplasm of breast: Secondary | ICD-10-CM | POA: Diagnosis not present

## 2019-06-09 ENCOUNTER — Other Ambulatory Visit: Payer: Self-pay | Admitting: Obstetrics & Gynecology

## 2019-06-09 DIAGNOSIS — R921 Mammographic calcification found on diagnostic imaging of breast: Secondary | ICD-10-CM

## 2019-06-09 DIAGNOSIS — R928 Other abnormal and inconclusive findings on diagnostic imaging of breast: Secondary | ICD-10-CM

## 2019-06-09 NOTE — Progress Notes (Signed)
The results of your recent mammogram reveals an area that needs further magnification views to determine if there is any concern or if it is just a false alarm. There is no suggestion of cancer, just a need for further images and evaluation by the radiologist. They should be contacting you, if not already, to schedule these additional mammogram pictures. If you have any questions, or if they have not yet contacted you, then please give [place] a call at [phone], so that we may help in this process. I know this seems worrisome, yet usually additional xrays clear up any suspicion for breast cancer.

## 2019-06-23 ENCOUNTER — Ambulatory Visit
Admission: RE | Admit: 2019-06-23 | Discharge: 2019-06-23 | Disposition: A | Discharge status: Home / Self Care | Payer: BC Managed Care – PPO | Source: Ambulatory Visit | Attending: Obstetrics & Gynecology | Admitting: Obstetrics & Gynecology

## 2019-06-23 DIAGNOSIS — R928 Other abnormal and inconclusive findings on diagnostic imaging of breast: Secondary | ICD-10-CM | POA: Insufficient documentation

## 2019-06-23 DIAGNOSIS — R921 Mammographic calcification found on diagnostic imaging of breast: Secondary | ICD-10-CM | POA: Insufficient documentation

## 2019-09-03 ENCOUNTER — Other Ambulatory Visit: Payer: Self-pay | Admitting: Obstetrics & Gynecology

## 2019-09-03 DIAGNOSIS — R928 Other abnormal and inconclusive findings on diagnostic imaging of breast: Secondary | ICD-10-CM

## 2019-10-28 LAB — FECAL OCCULT BLOOD, IMMUNOCHEMICAL: Fecal Occult Bld: NEGATIVE

## 2019-11-16 ENCOUNTER — Ambulatory Visit: Payer: BC Managed Care – PPO | Admitting: Obstetrics & Gynecology

## 2019-11-25 ENCOUNTER — Ambulatory Visit (INDEPENDENT_AMBULATORY_CARE_PROVIDER_SITE_OTHER): Payer: BC Managed Care – PPO | Admitting: Obstetrics & Gynecology

## 2019-11-25 ENCOUNTER — Encounter: Payer: Self-pay | Admitting: Obstetrics & Gynecology

## 2019-11-25 ENCOUNTER — Other Ambulatory Visit: Payer: Self-pay

## 2019-11-25 VITALS — BP 120/80 | Ht 66.0 in | Wt 154.0 lb

## 2019-11-25 DIAGNOSIS — N9089 Other specified noninflammatory disorders of vulva and perineum: Secondary | ICD-10-CM | POA: Diagnosis not present

## 2019-11-25 DIAGNOSIS — B9689 Other specified bacterial agents as the cause of diseases classified elsewhere: Secondary | ICD-10-CM | POA: Diagnosis not present

## 2019-11-25 DIAGNOSIS — A630 Anogenital (venereal) warts: Secondary | ICD-10-CM

## 2019-11-25 DIAGNOSIS — N760 Acute vaginitis: Secondary | ICD-10-CM

## 2019-11-25 MED ORDER — IMIQUIMOD 5 % EX CREA: TOPICAL_CREAM | CUTANEOUS | 3 refills | Status: AC

## 2019-11-25 MED ORDER — METRONIDAZOLE 500 MG PO TABS: 500.0000 mg | ORAL_TABLET | Freq: Two times a day (BID) | ORAL | 1 refills | Status: AC

## 2019-11-25 NOTE — Patient Instructions (Signed)
Imiquimod skin cream What is this medicine? IMIQUIMOD (i mi KWI mod) cream is used to treat external genital or anal warts. It is also used to treat other skin conditions such as actinic keratosis and certain types of skin cancer. This medicine may be used for other purposes; ask your health care provider or pharmacist if you have questions. COMMON BRAND NAME(S): Tawni Levy What should I tell my health care provider before I take this medicine? They need to know if you have any of these conditions:  decreased immune function  an unusual or allergic reaction to imiquimod, other medicines, foods, dyes, or preservatives  pregnant or trying to get pregnant  breast-feeding How should I use this medicine? This medicine is for external use only. Do not take by mouth. Follow the directions on the prescription label. Apply just before bedtime. Wash your hands before and after use. Apply a thin layer of cream and massage gently into the affected areas until no longer visible. Do not use in the mouth, eyes or the vagina. Use this medicine only on the affected area as directed by your health care provider. Do not use for longer than prescribed. It is important not to use more medicine than prescribed. To do so may increase the chance of side effects. Talk to your pediatrician regarding the use of this medicine in children. While this drug may be prescribed for children as young as 2 years of age for selected conditions, precautions do apply. Overdosage: If you think you have taken too much of this medicine contact a poison control center or emergency room at once. NOTE: This medicine is only for you. Do not share this medicine with others. What if I miss a dose? If you miss a dose, use it as soon as you can. If it is almost time for your next dose, use only that dose. Do not use double or extra doses. What may interact with this medicine? Interactions are not expected. Do not use any other medicines  on the treated area without asking your doctor or health care professional. This list may not describe all possible interactions. Give your health care provider a list of all the medicines, herbs, non-prescription drugs, or dietary supplements you use. Also tell them if you smoke, drink alcohol, or use illegal drugs. Some items may interact with your medicine. What should I watch for while using this medicine? Visit your health care professional for regular checks on your progress. Do not use this medicine until the skin has healed from any other drug (example: podofilox or podophyllin resin) or surgical skin treatment. Females should receive regular pelvic exams while being treated for genital warts. Most patients see improvement within 4 weeks. It may take up to 16 weeks to see a full clearing of the warts. This medicine is not a cure. New warts may develop during or after treatment. Avoid sexual (genital, anal, oral) contact while the cream is on the skin. If warts are visible in the genital area, sexual contact should be avoided until the warts are treated. The use of latex condoms during sexual contact may reduce, but not entirely prevent, infecting others. This medicine may weaken condoms, diaphragms, cervical caps or other barrier devices and make them less effective as birth control. Do not cover the treated area with an airtight bandage. Cotton gauze dressings can be used. Cotton underwear can be worn after using this medicine on the genital or anal area. Actinic keratoses that were not seen before may  appear during treatment and may later go away. The treatment area and surrounding area may lighten or darken after treatment with this medicine. These skin color changes may be permanent in some patients. If you experience a skin reaction at the treatment site that interferes or prevents you from doing any daily activity, contact your health care provider. You may need a rest period from treatment.  Treatment may be restarted once the reaction has gotten better as recommended by your doctor or health care professional. This medicine can make you more sensitive to the sun. Keep out of the sun. If you cannot avoid being in the sun, wear protective clothing and use sunscreen. Do not use sun lamps or tanning beds/booths. What side effects may I notice from receiving this medicine? Side effects that you should report to your doctor or health care professional as soon as possible:  open sores with or without drainage  skin infection  skin rash  unusual or severe skin reaction Side effects that usually do not require medical attention (report to your doctor or health care professional if they continue or are bothersome):  burning or itching  redness of the skin (very common but is usually not painful or harmful)  scabbing, crusting, or peeling skin  skin that becomes hard or thickened  swelling of the skin This list may not describe all possible side effects. Call your doctor for medical advice about side effects. You may report side effects to FDA at 1-800-FDA-1088. Where should I keep my medicine? Keep out of the reach of children. Store between 4 and 25 degrees C (39 and 77 degrees F). Do not freeze. Throw away any unused medicine after the expiration date. Discard packet after applying to affected area. Partial packets should not be saved or reused. NOTE: This sheet is a summary. It may not cover all possible information. If you have questions about this medicine, talk to your doctor, pharmacist, or health care provider.  2020 Elsevier/Gold Standard (2008-06-08 10:33:25)  

## 2019-11-25 NOTE — Progress Notes (Signed)
HPI:      [name] is a 54 y.o. G1P1001, No LMP recorded. Patient is postmenopausal., presents today for a problem visit.  She complains of:  Vulvar concern:   This is a 54 y.o. old Caucasian/White female who presents for the evaluation of vulvar lesion(s). She describes the vulvar lesion(s) as elevated, irregular surface. She indicates that she has noticed 1 lesions that the average size is approximately 21mm to 24mm.  She indicates she first noticed the problem three weeks ago. She admits to symptoms of irritation.  The following aggravating factors are identified: physical activity. The following alleviating factors are identified: none.  Shehas had no previous colposcopy for this condition.  She has however been dx w condyloma in past. The lesion has had no been biopsied. She has had no previous treatment for this condition.   PMHx: She  has a past medical history of Allergic rhinitis, Anxiety, Arthritis, Arthritis, Bipolar affective disorder (HCC), Bunion of right foot, Depression, GERD (gastroesophageal reflux disease), and Wears contact lenses. Also,  has a past surgical history that includes Bunionectomy; Hallux valgus [place]in (Right, 02/09/2015); Colonoscopy (N/A, 05/04/2016); Esophagogastroduodenoscopy (egd) with propofol (05/04/2016); and Foot surgery., family history includes Breast cancer in her paternal aunt; Heart failure in her mother; Hypertension in her father and mother; Rheum arthritis in her mother.,  reports that she has never smoked. She has never used smokeless tobacco. She reports current alcohol use of about 3.0 standard drinks of alcohol per week.  She has a current medication list which includes the following prescription(s): divalproex, fluticasone, ipratropium, lithium carbonate, propranolol, imiquimod, lithium carbonate, metronidazole, mometasone, montelukast, polyethylene glycol powder, and zaleplon. Also, has No Known Allergies.  Review of Systems  Constitutional:  Positive for weight loss. Negative for chills, fever and malaise/fatigue.  HENT: Negative for congestion, sinus pain and sore throat.   Eyes: Negative for blurred vision and pain.  Respiratory: Negative for cough and wheezing.   Cardiovascular: Negative for chest pain and leg swelling.  Gastrointestinal: Negative for abdominal pain, constipation, diarrhea, heartburn, nausea and vomiting.  Genitourinary: Negative for dysuria, frequency, hematuria and urgency.  Musculoskeletal: Negative for back pain, joint pain, myalgias and neck pain.  Skin: Negative for itching and rash.  Neurological: Negative for dizziness, tremors and weakness.  Endo/Heme/Allergies: Does not bruise/bleed easily.  Psychiatric/Behavioral: Negative for depression. The patient is not nervous/anxious and does not have insomnia.   All other systems reviewed and are negative.   Objective: BP 120/80   Ht 5\' 6"  (1.676 m)   Wt 154 lb (69.9 kg)   BMI 24.86 kg/m  Physical Exam Constitutional:      General: She is not in acute distress.    Appearance: She is well-developed.  Genitourinary:     Pelvic exam was performed with patient supine.     Inguinal canal, urethra, bladder, vagina and uterus normal.     Vulval lesion present.     No posterior fourchette lesion present.        No vaginal erythema or bleeding.     No cervical motion tenderness, discharge, polyp or nabothian cyst.     Uterus is mobile.     Uterus is not enlarged.     No uterine mass detected.    Uterus is midaxial.     No right or left adnexal mass present.     Right adnexa not tender.     Left adnexa not tender.  HENT:     Head: Normocephalic and atraumatic.  Nose: Nose normal.  Abdominal:     General: There is no distension.     Palpations: Abdomen is soft.     Tenderness: There is no abdominal tenderness.  Musculoskeletal:        General: Normal range of motion.  Neurological:     Mental Status: She is alert and oriented to person,  place, and time.     Cranial Nerves: No cranial nerve deficit.  Skin:    General: Skin is warm and dry.  Psychiatric:        Attention and Perception: Attention normal.        Mood and Affect: Mood and affect normal.        Speech: Speech normal.        Behavior: Behavior normal.        Thought Content: Thought content normal.        Judgment: Judgment normal.     ASSESSMENT/PLAN:    Problem List Items Addressed This Visit    Condyloma acuminata  Relapse/recurrence   Relevant Medications   imiquimod (ALDARA) 5 % cream   Lesion of labia        One lesion only, c/w wart    Will restart Aldara    Consider TCA or other means if persists   Bacterial vaginitis    (chronic, recurrent)   Relevant Medications   metroNIDAZOLE (FLAGYL) 500 MG tablet Restart Boric Acid twice weekly      Rebecca Applebaum, MD, [place], [facility] 11/25/2019  2:24 PM

## 2020-04-06 ENCOUNTER — Ambulatory Visit
Admission: EM | Admit: 2020-04-06 | Discharge: 2020-04-06 | Discharge status: Against Medical Advice | Payer: BC Managed Care – PPO

## 2020-06-14 ENCOUNTER — Other Ambulatory Visit: Payer: Self-pay | Admitting: Otolaryngology

## 2020-07-14 ENCOUNTER — Other Ambulatory Visit: Payer: BC Managed Care – PPO

## 2020-07-20 ENCOUNTER — Ambulatory Visit: Payer: BC Managed Care – PPO | Admitting: Obstetrics & Gynecology

## 2020-08-05 ENCOUNTER — Other Ambulatory Visit: Payer: Self-pay

## 2020-08-05 ENCOUNTER — Other Ambulatory Visit (HOSPITAL_COMMUNITY)
Admission: RE | Admit: 2020-08-05 | Discharge: 2020-08-05 | Disposition: A | Discharge status: Home / Self Care | Payer: No Typology Code available for payment source | Source: Ambulatory Visit | Attending: Obstetrics & Gynecology | Admitting: Obstetrics & Gynecology

## 2020-08-05 ENCOUNTER — Encounter: Payer: Self-pay | Admitting: Obstetrics & Gynecology

## 2020-08-05 ENCOUNTER — Ambulatory Visit (INDEPENDENT_AMBULATORY_CARE_PROVIDER_SITE_OTHER): Payer: No Typology Code available for payment source | Admitting: Obstetrics & Gynecology

## 2020-08-05 VITALS — BP 120/80 | Ht 66.0 in | Wt 160.0 lb

## 2020-08-05 DIAGNOSIS — Z124 Encounter for screening for malignant neoplasm of cervix: Secondary | ICD-10-CM | POA: Diagnosis not present

## 2020-08-05 DIAGNOSIS — Z01419 Encounter for gynecological examination (general) (routine) without abnormal findings: Secondary | ICD-10-CM | POA: Diagnosis not present

## 2020-08-05 DIAGNOSIS — Z1231 Encounter for screening mammogram for malignant neoplasm of breast: Secondary | ICD-10-CM | POA: Diagnosis not present

## 2020-08-05 DIAGNOSIS — N951 Menopausal and female climacteric states: Secondary | ICD-10-CM | POA: Diagnosis not present

## 2020-08-05 NOTE — Patient Instructions (Addendum)
Recommendations to boost your immunity to prevent illness such as viral flu and colds, including covid19, are as follows:       - - -  Vitamin K2 and Vitamin D3  - - - Take Vitamin K2 at 200-300 mcg daily (usually 2-3 pills daily of the over the counter formulation). Take Vitamin D3 at 3000-4000 U daily (usually 3-4 pills daily of the over the counter formulation). Studies show that these two at high normal levels in your system are very effective in keeping your immunity so strong and protective that you will be unlikely to contract viral illness such as those listed above.  Thank you for choosing [facility]. As part of our ongoing efforts to improve patient experience, we would appreciate your feedback. Please fill out the short survey that you will receive by mail or MyChart. Your opinion is important to [place]! -Dr Tiburcio Pea  PAP every three years Mammogram every year    Call [phone] to schedule at [facility] Colonoscopy every 10 years Labs yearly (with PCP)  Replens 3 times weekly for vaginal moisturizer therapy

## 2020-08-05 NOTE — Progress Notes (Signed)
HPI:      [name] is a 55 y.o. G1P1001 who LMP was in the past, she presents today for her annual examination.  The patient has no complaints today.  She has occas occurrence of a vulvar wart as previously diagnosed. Has used Aldara in past.  The patient is sexually active w some sx's of vaginal dryness ad even discomfort at times. Herlast pap: approximate date 2019 and was normal and last mammogram: approximate date 2020 and was normal.  The patient does perform self breast exams.  There is no notable family history of breast or ovarian cancer in her family. The patient is not taking hormone replacement therapy. Patient denies post-menopausal vaginal bleeding.   The patient has regular exercise: yes. The patient denies current symptoms of depression.    GYN Hx: Last Colonoscopy:5 years ago. Normal.    PMHx: Past Medical History:  Diagnosis Date  . Allergic rhinitis    SINUSITIS  . Anxiety   . Arthritis    feet, knees  . Arthritis   . Bipolar affective disorder (HCC)    [facility]  . Bunion of right foot   . Depression   . GERD (gastroesophageal reflux disease)   . Wears contact lenses    one eye only   Past Surgical History:  Procedure Laterality Date  . BUNIONECTOMY    . COLONOSCOPY N/A 05/04/2016   Procedure: COLONOSCOPY;  Surgeon: Christena Deem, MD;  Location: [facility] ENDOSCOPY;  Service: Endoscopy;  Laterality: N/A;  . ESOPHAGOGASTRODUODENOSCOPY (EGD) WITH PROPOFOL  05/04/2016   Procedure: ESOPHAGOGASTRODUODENOSCOPY (EGD) WITH PROPOFOL;  Surgeon: Christena Deem, MD;  Location: [facility] ENDOSCOPY;  Service: Endoscopy;;  . FOOT SURGERY    . HALLUX VALGUS [place]USTIN Right 02/09/2015   Procedure: HALLUX VALGUS AUSTIN;  Surgeon: Gwyneth Revels, DPM;  Location: [facility];  Service: Podiatry;  Laterality: Right;  POPLITEAL   Family History  Problem Relation Age of Onset  . Breast cancer Paternal Aunt   . Hypertension Mother   . Heart failure  Mother   . Rheum arthritis Mother   . Hypertension Father    Social History   Tobacco Use  . Smoking status: Never Smoker  . Smokeless tobacco: Never Used  Vaping Use  . Vaping Use: Never used  Substance Use Topics  . Alcohol use: Yes    Alcohol/week: 3.0 standard drinks    Types: 3 Glasses of wine per week    Current Outpatient Medications:  .  divalproex (DEPAKOTE ER) 500 MG 24 hr tablet, Take 1,000 mg by mouth. , Disp: , Rfl:  .  fluticasone (FLONASE) 50 MCG/ACT nasal spray, by Nasal route., Disp: , Rfl:  .  imiquimod (ALDARA) 5 % cream, Apply topically 3 (three) times a week. Apply until total clearance or maximum of 16 weeks, Disp: 12 each, Rfl: 3 .  lithium carbonate 300 MG capsule, Take 600 mg by mouth daily., Disp: , Rfl:  .  propranolol (INDERAL) 20 MG tablet, , Disp: , Rfl:  .  ipratropium (ATROVENT) 0.03 % nasal spray, Place 2 sprays into both nostrils daily. (Patient not taking: Reported on 08/05/2020), Disp: , Rfl:  .  metroNIDAZOLE (FLAGYL) 500 MG tablet, Take 1 tablet (500 mg total) by mouth 2 (two) times daily. (Patient not taking: Reported on 08/05/2020), Disp: 14 tablet, Rfl: 1 .  montelukast (SINGULAIR) 10 MG tablet, TAKE 1 TABLET NIGHTLY (Patient not taking: Reported on 08/05/2020), Disp: , Rfl:  .  zaleplon (SONATA)  10 MG capsule, , Disp: , Rfl:  Allergies: Patient has no known allergies.  Review of Systems  Constitutional: Negative for chills, fever and malaise/fatigue.  HENT: Negative for congestion, sinus pain and sore throat.   Eyes: Negative for blurred vision and pain.  Respiratory: Negative for cough and wheezing.   Cardiovascular: Negative for chest pain and leg swelling.  Gastrointestinal: Negative for abdominal pain, constipation, diarrhea, heartburn, nausea and vomiting.  Genitourinary: Negative for dysuria, frequency, hematuria and urgency.  Musculoskeletal: Negative for back pain, joint pain, myalgias and neck pain.  Skin: Negative for itching and  rash.  Neurological: Negative for dizziness, tremors and weakness.  Endo/Heme/Allergies: Does not bruise/bleed easily.  Psychiatric/Behavioral: Negative for depression. The patient is not nervous/anxious and does not have insomnia.     Objective: BP 120/80   Ht 5\' 6"  (1.676 m)   Wt 160 lb (72.6 kg)   BMI 25.82 kg/m   Filed Weights   08/05/20 1414  Weight: 160 lb (72.6 kg)   Body mass index is 25.82 kg/m. Physical Exam Constitutional:      General: She is not in acute distress.    Appearance: She is well-developed and well-nourished.  Genitourinary:     Vagina, uterus and rectum normal.     There is no rash or lesion on the right labia.     There is no rash or lesion on the left labia.    No lesions in the vagina.     No vaginal bleeding.      Right Adnexa: not tender and no mass present.    Left Adnexa: not tender and no mass present.    No cervical motion tenderness, friability, lesion or polyp.     Uterus is mobile.     Uterus is not enlarged.     No uterine mass detected.    Uterus is midaxial.     Pelvic exam was performed with patient supine.  Breasts:     Right: No mass, skin change or tenderness.     Left: No mass, skin change or tenderness.    HENT:     Head: Normocephalic and atraumatic. No laceration.     Right Ear: Hearing normal.     Left Ear: Hearing normal.     Nose: No epistaxis or foreign body.     Mouth/Throat:     Mouth: Oropharynx is clear and moist and mucous membranes are normal.     Pharynx: Uvula midline.  Eyes:     Pupils: Pupils are equal, round, and reactive to light.  Neck:     Thyroid: No thyromegaly.  Cardiovascular:     Rate and Rhythm: Normal rate and regular rhythm.     Heart sounds: No murmur heard. No friction rub. No gallop.   Pulmonary:     Effort: Pulmonary effort is normal. No respiratory distress.     Breath sounds: Normal breath sounds. No wheezing.  Abdominal:     General: Bowel sounds are normal. There is no  distension.     Palpations: Abdomen is soft.     Tenderness: There is no abdominal tenderness. There is no rebound.  Musculoskeletal:        General: Normal range of motion.     Cervical back: Normal range of motion and neck supple.  Neurological:     Mental Status: She is alert and oriented to person, place, and time.     Cranial Nerves: No cranial nerve deficit.  Skin:  General: Skin is warm and dry.  Psychiatric:        Mood and Affect: Mood and affect normal.        Judgment: Judgment normal.  Vitals reviewed.     Assessment: Annual Exam 1. Women's annual routine gynecological examination   2. Encounter for screening mammogram for malignant neoplasm of breast   3. Screening for cervical cancer   4. Vaginal dryness, menopausal     Plan:            1.  Cervical Screening-  Pap smear done today  2. Breast screening- Exam annually and mammogram scheduled  3. Colonoscopy every 10 years, Hemoccult testing after age 75  4. Labs managed by PCP  5. Counseling for hormonal therapy: none   6. Replens for vag dryness. Alternatives discussed   7. Aldara prn for wart    F/U  Return in about 1 year (around 08/05/2021) for Annual.  Annamarie Major, MD, [facility], [facility] 08/05/2020  2:52 PM

## 2020-08-10 LAB — CYTOLOGY - PAP
Comment: NEGATIVE
Diagnosis: NEGATIVE
High risk HPV: NEGATIVE

## 2020-08-17 ENCOUNTER — Inpatient Hospital Stay: Admission: RE | Admit: 2020-08-17 | Payer: No Typology Code available for payment source | Source: Ambulatory Visit

## 2020-08-21 ENCOUNTER — Other Ambulatory Visit: Payer: Self-pay | Admitting: Obstetrics & Gynecology

## 2020-08-22 ENCOUNTER — Other Ambulatory Visit: Payer: Self-pay

## 2020-08-22 ENCOUNTER — Ambulatory Visit
Admission: RE | Admit: 2020-08-22 | Discharge: 2020-08-22 | Disposition: A | Discharge status: Home / Self Care | Payer: No Typology Code available for payment source | Source: Ambulatory Visit | Attending: Obstetrics & Gynecology | Admitting: Obstetrics & Gynecology

## 2020-08-22 DIAGNOSIS — Z1231 Encounter for screening mammogram for malignant neoplasm of breast: Secondary | ICD-10-CM | POA: Diagnosis not present

## 2020-08-26 ENCOUNTER — Other Ambulatory Visit: Payer: Self-pay | Admitting: Obstetrics & Gynecology

## 2020-08-26 DIAGNOSIS — R928 Other abnormal and inconclusive findings on diagnostic imaging of breast: Secondary | ICD-10-CM

## 2020-09-05 ENCOUNTER — Ambulatory Visit
Admission: RE | Admit: 2020-09-05 | Discharge: 2020-09-05 | Disposition: A | Discharge status: Home / Self Care | Payer: No Typology Code available for payment source | Source: Ambulatory Visit | Attending: Obstetrics & Gynecology | Admitting: Obstetrics & Gynecology

## 2020-09-05 ENCOUNTER — Other Ambulatory Visit: Payer: Self-pay

## 2020-09-05 DIAGNOSIS — R928 Other abnormal and inconclusive findings on diagnostic imaging of breast: Secondary | ICD-10-CM

## 2021-01-04 ENCOUNTER — Telehealth: Payer: No Typology Code available for payment source

## 2021-01-04 NOTE — Telephone Encounter (Signed)
Pt wants to know if her second mammogram can be coded as screening so her insurance will cover it?

## 2021-01-04 NOTE — Telephone Encounter (Signed)
Mammogram on 08/22/20 was screening.  Mammogram on 09/05/20 was left sided diagnostic.  Cannot make that into a screen, by definition.  Future mammogram will be screening again.

## 2021-01-05 NOTE — Telephone Encounter (Signed)
Called pt to advise, no answer and mailbox was full.

## 2021-08-28 IMAGING — MG MM DIGITAL SCREENING BILAT W/ TOMO AND CAD
6 of 10 series · 6 of 30 positions shown · non-contrast
Comparison: Previous exam(s).

CLINICAL DATA: Screening.

EXAM:
DIGITAL SCREENING BILATERAL MAMMOGRAM WITH TOMOSYNTHESIS AND CAD
TECHNIQUE: Bilateral screening digital craniocaudal and mediolateral oblique
mammograms were obtained. Bilateral screening digital breast
tomosynthesis was performed. The images were evaluated with
computer-aided detection.

[L MLO synth-2D]
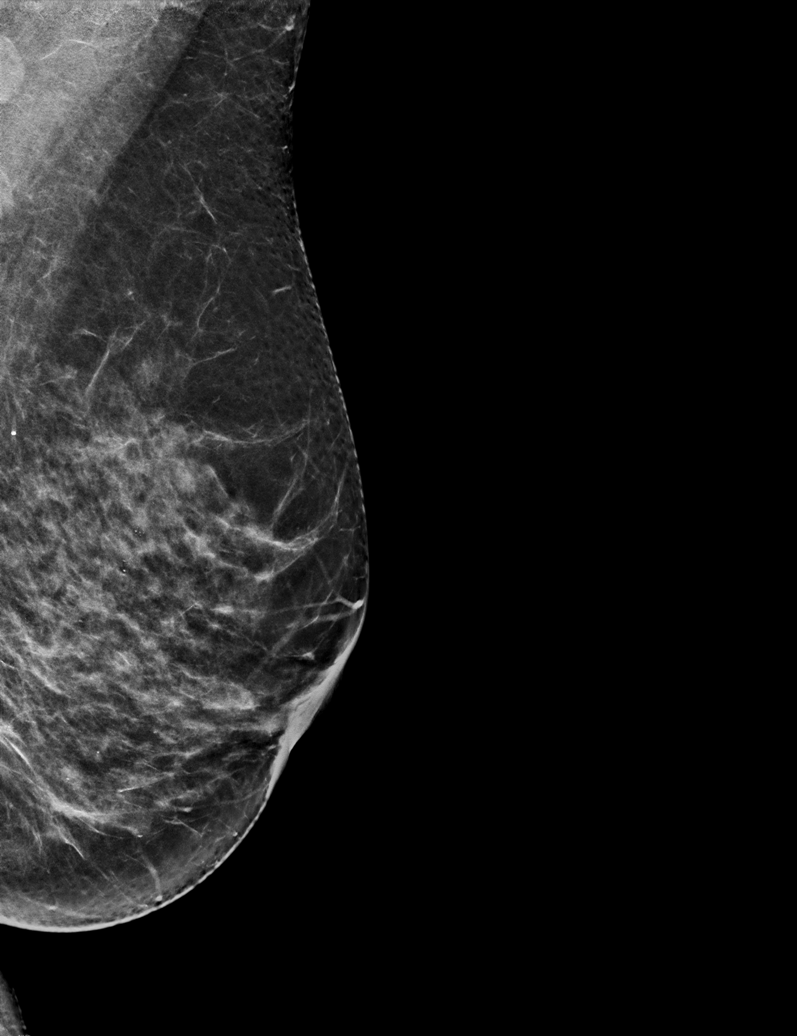

[R CC synth-2D]
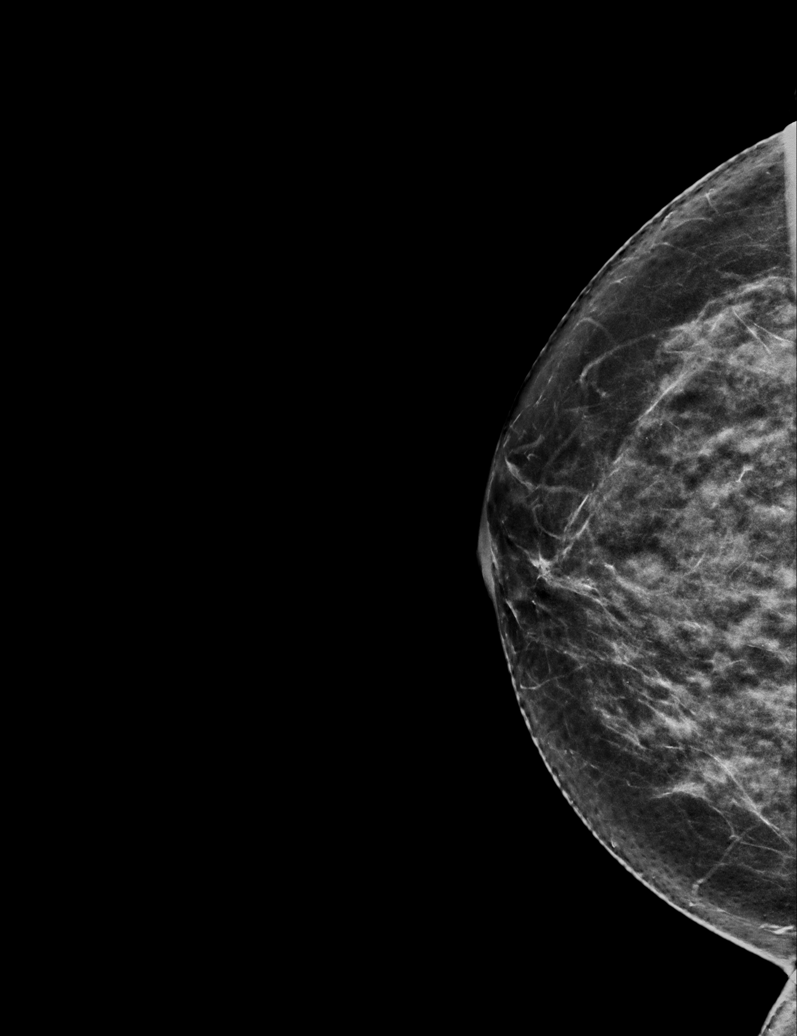

[R MLO synth-2D]
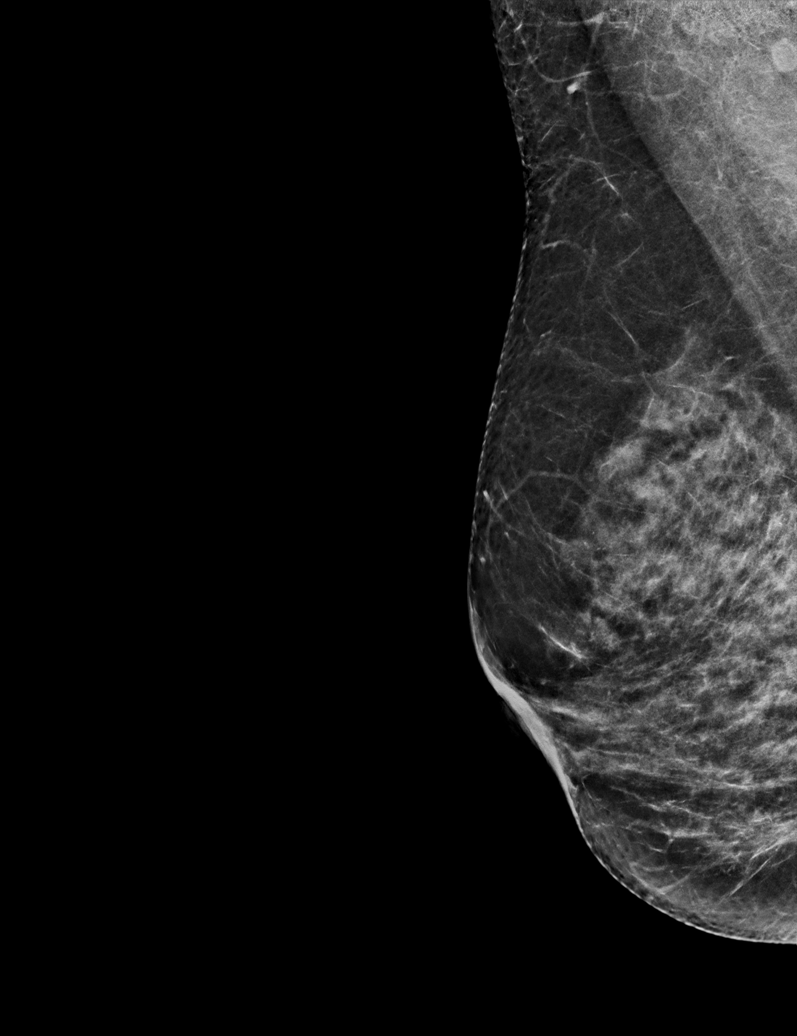

[L CC synth-2D]
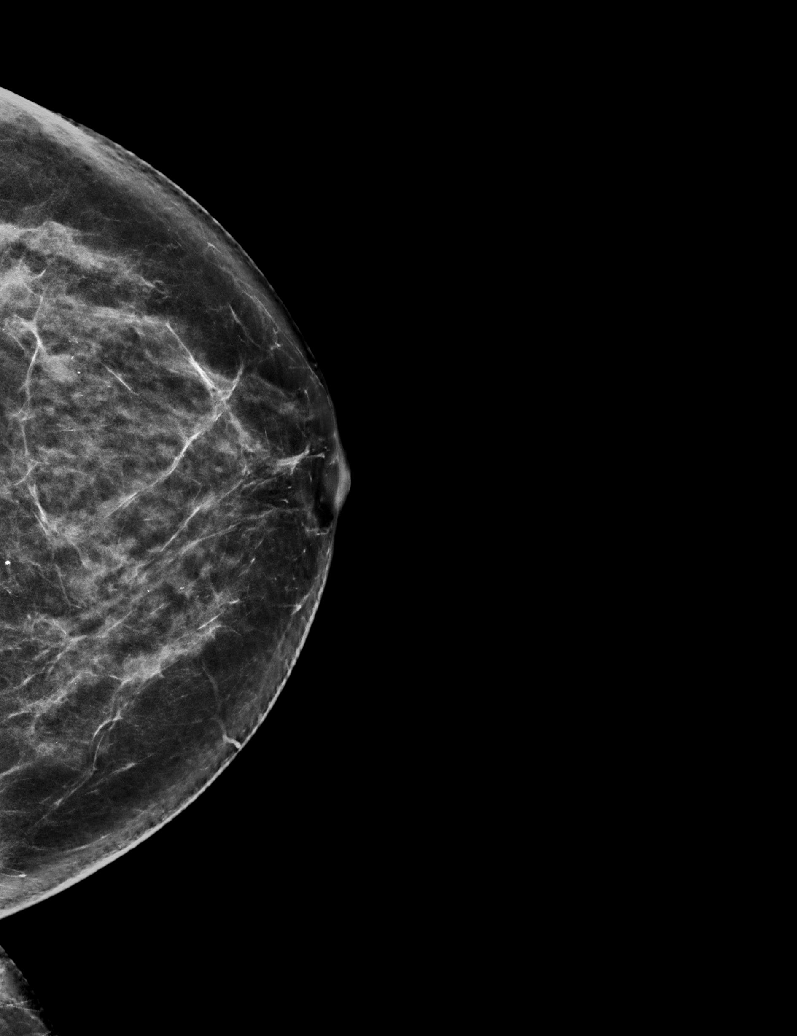

[R XCCL synth-2D]
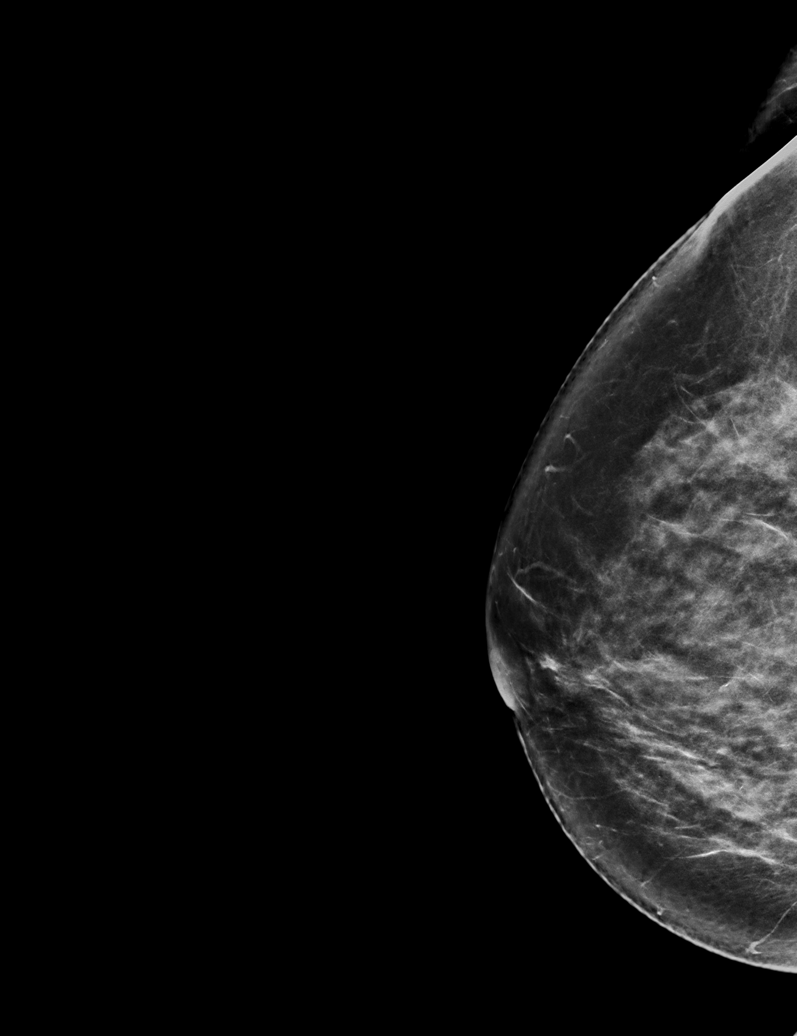

[L CC tomo · tomo slice 36/71.0]
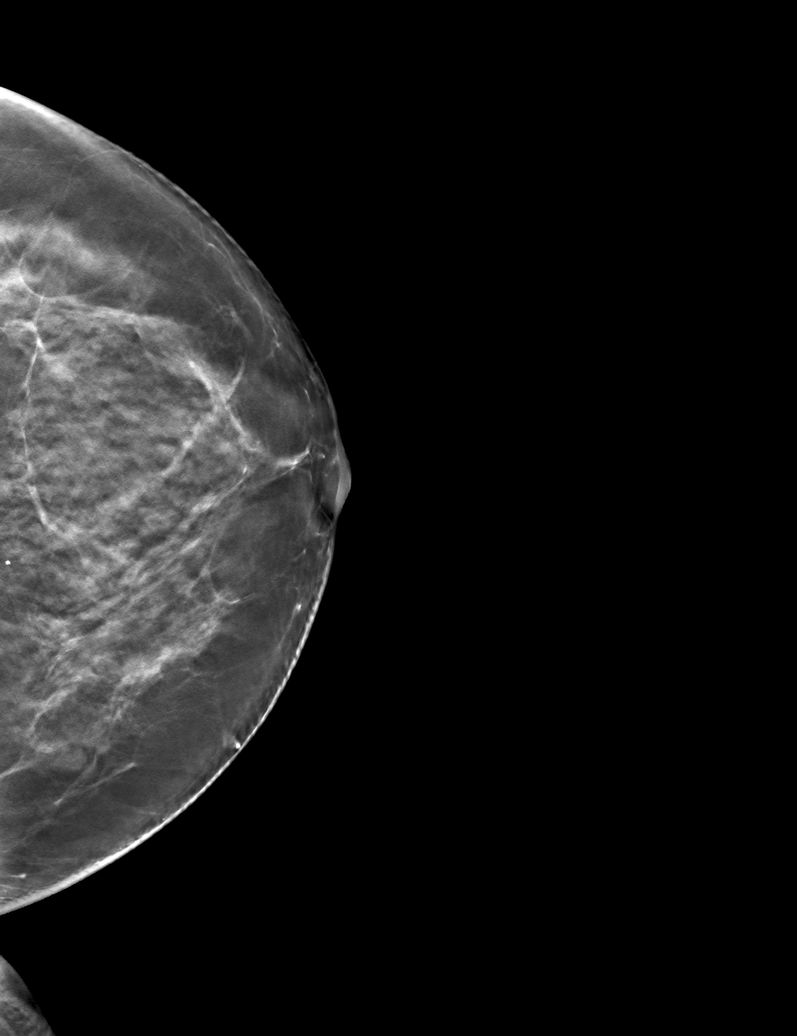

[6 of 30 positions shown; findings below may reference images not displayed]

ACR Breast Density Category c: The breast tissue is heterogeneously
dense, which may obscure small masses.
FINDINGS: In the left breast, a possible asymmetry warrants further
evaluation. In the right breast, no findings suspicious for
malignancy.
IMPRESSION: Further evaluation is suggested for possible asymmetry in the left
breast.

RECOMMENDATION:
Diagnostic mammogram and possibly ultrasound of the left breast.
(Code:8C-V-11X)

The patient will be contacted regarding the findings, and additional
imaging will be scheduled.

BI-RADS CATEGORY  0: Incomplete. Need additional imaging evaluation
and/or prior mammograms for comparison.

## 2021-10-09 ENCOUNTER — Ambulatory Visit: Payer: No Typology Code available for payment source | Admitting: Obstetrics & Gynecology

## 2022-02-08 ENCOUNTER — Other Ambulatory Visit: Payer: Self-pay | Admitting: Family Medicine

## 2022-02-08 DIAGNOSIS — Z1231 Encounter for screening mammogram for malignant neoplasm of breast: Secondary | ICD-10-CM

## 2022-02-28 ENCOUNTER — Ambulatory Visit
Admission: RE | Admit: 2022-02-28 | Discharge: 2022-02-28 | Disposition: A | Discharge status: Home / Self Care | Payer: BC Managed Care – PPO | Source: Ambulatory Visit | Attending: Family Medicine | Admitting: Family Medicine

## 2022-02-28 DIAGNOSIS — Z1231 Encounter for screening mammogram for malignant neoplasm of breast: Secondary | ICD-10-CM | POA: Diagnosis present

## 2022-03-01 ENCOUNTER — Other Ambulatory Visit: Payer: Self-pay | Admitting: Family Medicine

## 2022-03-08 ENCOUNTER — Other Ambulatory Visit: Payer: Self-pay | Admitting: Family Medicine

## 2022-03-08 DIAGNOSIS — R921 Mammographic calcification found on diagnostic imaging of breast: Secondary | ICD-10-CM

## 2022-03-08 DIAGNOSIS — R928 Other abnormal and inconclusive findings on diagnostic imaging of breast: Secondary | ICD-10-CM

## 2022-03-29 ENCOUNTER — Ambulatory Visit
Admission: RE | Admit: 2022-03-29 | Discharge: 2022-03-29 | Disposition: A | Discharge status: Home / Self Care | Payer: BC Managed Care – PPO | Source: Ambulatory Visit | Attending: Family Medicine | Admitting: Family Medicine

## 2022-03-29 DIAGNOSIS — R921 Mammographic calcification found on diagnostic imaging of breast: Secondary | ICD-10-CM | POA: Diagnosis present

## 2022-03-29 DIAGNOSIS — R928 Other abnormal and inconclusive findings on diagnostic imaging of breast: Secondary | ICD-10-CM | POA: Insufficient documentation

## 2022-10-30 ENCOUNTER — Encounter: Payer: Self-pay | Admitting: Family Medicine

## 2022-10-30 ENCOUNTER — Other Ambulatory Visit: Payer: Self-pay | Admitting: Family Medicine

## 2022-10-30 DIAGNOSIS — R921 Mammographic calcification found on diagnostic imaging of breast: Secondary | ICD-10-CM

## 2022-10-30 DIAGNOSIS — R928 Other abnormal and inconclusive findings on diagnostic imaging of breast: Secondary | ICD-10-CM

## 2022-11-13 ENCOUNTER — Other Ambulatory Visit: Payer: Self-pay | Admitting: Family Medicine

## 2022-11-13 DIAGNOSIS — R921 Mammographic calcification found on diagnostic imaging of breast: Secondary | ICD-10-CM

## 2022-11-28 ENCOUNTER — Ambulatory Visit
Admission: RE | Admit: 2022-11-28 | Discharge: 2022-11-28 | Disposition: A | Discharge status: Home / Self Care | Payer: BC Managed Care – PPO | Source: Ambulatory Visit | Attending: Family Medicine | Admitting: Family Medicine

## 2022-11-28 DIAGNOSIS — R921 Mammographic calcification found on diagnostic imaging of breast: Secondary | ICD-10-CM

## 2022-11-29 ENCOUNTER — Encounter: Payer: Self-pay | Admitting: Family Medicine

## 2023-07-18 ENCOUNTER — Other Ambulatory Visit: Payer: Self-pay | Admitting: Internal Medicine

## 2023-07-18 DIAGNOSIS — Z1231 Encounter for screening mammogram for malignant neoplasm of breast: Secondary | ICD-10-CM

## 2023-08-19 ENCOUNTER — Other Ambulatory Visit: Payer: Self-pay | Admitting: Family Medicine

## 2023-08-19 DIAGNOSIS — R928 Other abnormal and inconclusive findings on diagnostic imaging of breast: Secondary | ICD-10-CM

## 2023-08-20 ENCOUNTER — Other Ambulatory Visit: Payer: Self-pay | Admitting: Family Medicine

## 2023-08-20 DIAGNOSIS — R928 Other abnormal and inconclusive findings on diagnostic imaging of breast: Secondary | ICD-10-CM

## 2023-08-20 DIAGNOSIS — Z1231 Encounter for screening mammogram for malignant neoplasm of breast: Secondary | ICD-10-CM

## 2023-08-20 DIAGNOSIS — R921 Mammographic calcification found on diagnostic imaging of breast: Secondary | ICD-10-CM

## 2023-09-03 ENCOUNTER — Ambulatory Visit
Admission: RE | Admit: 2023-09-03 | Discharge: 2023-09-03 | Disposition: A | Discharge status: Home / Self Care | Payer: BC Managed Care – PPO | Source: Ambulatory Visit | Attending: Family Medicine | Admitting: Family Medicine

## 2023-09-03 DIAGNOSIS — R928 Other abnormal and inconclusive findings on diagnostic imaging of breast: Secondary | ICD-10-CM | POA: Diagnosis present

## 2023-09-03 DIAGNOSIS — Z1231 Encounter for screening mammogram for malignant neoplasm of breast: Secondary | ICD-10-CM | POA: Diagnosis present

## 2023-09-03 DIAGNOSIS — R921 Mammographic calcification found on diagnostic imaging of breast: Secondary | ICD-10-CM
